# Patient Record
Sex: Female | Born: 1985 | Race: White | Hispanic: No | Marital: Married | Smoking: Never smoker
Health system: Southern US, Community
[De-identification: ages and names within clinical notes are randomized; demographics above are authoritative.]

## PROBLEM LIST (undated history)

## (undated) ENCOUNTER — Inpatient Hospital Stay: Payer: Self-pay

## (undated) DIAGNOSIS — K589 Irritable bowel syndrome without diarrhea: Secondary | ICD-10-CM

## (undated) DIAGNOSIS — D649 Anemia, unspecified: Secondary | ICD-10-CM

## (undated) DIAGNOSIS — Z789 Other specified health status: Secondary | ICD-10-CM

## (undated) DIAGNOSIS — N200 Calculus of kidney: Secondary | ICD-10-CM

## (undated) DIAGNOSIS — N63 Unspecified lump in unspecified breast: Secondary | ICD-10-CM

## (undated) DIAGNOSIS — K5190 Ulcerative colitis, unspecified, without complications: Secondary | ICD-10-CM

## (undated) HISTORY — DX: Calculus of kidney: N20.0

## (undated) HISTORY — PX: NO PAST SURGERIES: SHX2092

---

## 2016-05-23 LAB — OB RESULTS CONSOLE RPR: RPR: NONREACTIVE

## 2016-05-23 LAB — OB RESULTS CONSOLE RUBELLA ANTIBODY, IGM: Rubella: IMMUNE

## 2016-05-23 LAB — OB RESULTS CONSOLE HEPATITIS B SURFACE ANTIGEN: Hepatitis B Surface Ag: NEGATIVE

## 2016-05-23 LAB — OB RESULTS CONSOLE HIV ANTIBODY (ROUTINE TESTING): HIV: NONREACTIVE

## 2016-10-22 DIAGNOSIS — N10 Acute tubulo-interstitial nephritis: Secondary | ICD-10-CM | POA: Diagnosis not present

## 2016-10-22 DIAGNOSIS — O26893 Other specified pregnancy related conditions, third trimester: Secondary | ICD-10-CM | POA: Diagnosis not present

## 2016-10-22 DIAGNOSIS — Z3A30 30 weeks gestation of pregnancy: Secondary | ICD-10-CM | POA: Insufficient documentation

## 2016-10-22 DIAGNOSIS — R109 Unspecified abdominal pain: Secondary | ICD-10-CM | POA: Diagnosis present

## 2016-10-22 LAB — URINALYSIS COMPLETE WITH MICROSCOPIC (ARMC ONLY)
Bilirubin Urine: NEGATIVE
Glucose, UA: NEGATIVE mg/dL
Ketones, ur: NEGATIVE mg/dL
Nitrite: NEGATIVE
Protein, ur: NEGATIVE mg/dL
Specific Gravity, Urine: 1.002 — ABNORMAL LOW (ref 1.005–1.030)
pH: 7 (ref 5.0–8.0)

## 2016-10-22 NOTE — ED Triage Notes (Signed)
Pt states sudden onset of rt flank pain, pt reports that she is [redacted] weeks pregnant, denies vaginal bleeding or abd tightening, pt reports some pain with urination, + nausea

## 2016-10-23 ENCOUNTER — Emergency Department: Payer: BLUE CROSS/BLUE SHIELD

## 2016-10-23 ENCOUNTER — Encounter: Payer: Self-pay | Admitting: Emergency Medicine

## 2016-10-23 ENCOUNTER — Inpatient Hospital Stay
Admission: EM | Admit: 2016-10-23 | Discharge: 2016-10-23 | Disposition: A | Discharge status: Home / Self Care | Payer: BLUE CROSS/BLUE SHIELD | Attending: Obstetrics and Gynecology | Admitting: Obstetrics and Gynecology

## 2016-10-23 DIAGNOSIS — R109 Unspecified abdominal pain: Secondary | ICD-10-CM

## 2016-10-23 DIAGNOSIS — N10 Acute tubulo-interstitial nephritis: Secondary | ICD-10-CM

## 2016-10-23 HISTORY — DX: Other specified health status: Z78.9

## 2016-10-23 HISTORY — DX: Irritable bowel syndrome, unspecified: K58.9

## 2016-10-23 HISTORY — DX: Anemia, unspecified: D64.9

## 2016-10-23 LAB — CBC
HCT: 27.6 % — ABNORMAL LOW (ref 35.0–47.0)
Hemoglobin: 9.2 g/dL — ABNORMAL LOW (ref 12.0–16.0)
MCH: 30.3 pg (ref 26.0–34.0)
MCHC: 33.3 g/dL (ref 32.0–36.0)
MCV: 90.8 fL (ref 80.0–100.0)
Platelets: 241 10*3/uL (ref 150–440)
RBC: 3.04 MIL/uL — ABNORMAL LOW (ref 3.80–5.20)
RDW: 13.8 % (ref 11.5–14.5)
WBC: 14.3 10*3/uL — ABNORMAL HIGH (ref 3.6–11.0)

## 2016-10-23 LAB — COMPREHENSIVE METABOLIC PANEL
ALT: 9 U/L — ABNORMAL LOW (ref 14–54)
AST: 16 U/L (ref 15–41)
Albumin: 2.8 g/dL — ABNORMAL LOW (ref 3.5–5.0)
Alkaline Phosphatase: 115 U/L (ref 38–126)
Anion gap: 6 (ref 5–15)
BUN: 8 mg/dL (ref 6–20)
CO2: 24 mmol/L (ref 22–32)
Calcium: 8.7 mg/dL — ABNORMAL LOW (ref 8.9–10.3)
Chloride: 106 mmol/L (ref 101–111)
Creatinine, Ser: 0.32 mg/dL — ABNORMAL LOW (ref 0.44–1.00)
GFR calc Af Amer: >60 mL/min (ref >60)
GFR calc non Af Amer: >60 mL/min (ref >60)
Glucose, Bld: 89 mg/dL (ref 65–99)
Potassium: 3.8 mmol/L (ref 3.5–5.1)
Sodium: 136 mmol/L (ref 135–145)
Total Bilirubin: <0.1 mg/dL — ABNORMAL LOW (ref 0.3–1.2)
Total Protein: 7.5 g/dL (ref 6.5–8.1)

## 2016-10-23 MED ORDER — NITROFURANTOIN MONOHYD MACRO 100 MG PO CAPS
100.0000 mg | ORAL_CAPSULE | Freq: Once | ORAL | Status: AC
  Administered 2016-10-23: 100 mg via ORAL
  Filled 2016-10-23: qty 1

## 2016-10-23 MED ORDER — NITROFURANTOIN MONOHYD MACRO 100 MG PO CAPS: 100.0000 mg | ORAL_CAPSULE | Freq: Two times a day (BID) | ORAL | 0 refills | Status: DC

## 2016-10-23 MED ORDER — SODIUM CHLORIDE 0.9 % IV BOLUS (SEPSIS)
1000.0000 mL | Freq: Once | INTRAVENOUS | Status: AC
  Administered 2016-10-23: 1000 mL via INTRAVENOUS

## 2016-10-23 NOTE — ED Provider Notes (Signed)
[facility] Emergency Department Provider Note   ____________________________________________   First MD Initiated Contact with Patient 10/23/16 0335     (approximate)  I have reviewed the triage vital signs and the nursing notes.   HISTORY  Chief Complaint Flank Pain    HPI Novalie Leamy is a 30 y.o. female who comes into the hospital today with vaginal pain. She feels that she has a UTI. The patient reports that she also has some right flank pain. Everything started this morning. The vaginal discomfort was this morning but the back pain started this evening. The patient reports that she took 2 Tylenol. She's had no fevers no vomiting and no abdominal pain. The patient states that she has had some burning with urination. Her OB/GYN is in [place]. Currently the patient's pain is a 1 out of 10 in intensity. The patient is nauseous but has had no vomiting. She is [redacted] weeks pregnant with an unremarkable pregnancy. The patient is here tonight to find out what may be going on and have some evaluation.   History reviewed. No pertinent past medical history.  There are no active problems to display for this patient.   History reviewed. No pertinent surgical history.  Prior to Admission medications   Not on File    Allergies Ceclor [cefaclor]; Codeine; Ibuprofen; Penicillins; and Sulfa antibiotics  No family history on file.  Social History Social History  Substance Use Topics  . Smoking status: Never Smoker  . Smokeless tobacco: Not on file  . Alcohol use No    Review of Systems Constitutional: No fever/chills Eyes: No visual changes. ENT: No sore throat. Cardiovascular: Denies chest pain. Respiratory: Denies shortness of breath. Gastrointestinal: No abdominal pain.  No nausea, no vomiting.  No diarrhea.  No constipation. Genitourinary:  dysuria. Musculoskeletal: right back pain. Skin: Negative for rash. Neurological: Negative for headaches,  focal weakness or numbness.  10-point ROS otherwise negative.  ____________________________________________   PHYSICAL EXAM:  VITAL SIGNS: ED Triage Vitals  Enc Vitals Group     BP 10/22/16 2327 104/70     Pulse Rate 10/22/16 2327 99     Resp 10/22/16 2327 18     Temp 10/22/16 2327 97.4 F (36.3 C)     Temp Source 10/22/16 2327 Oral     SpO2 10/22/16 2327 100 %     Weight 10/22/16 2328 120 lb (54.4 kg)     Height 10/22/16 2328 5' 1"  (1.549 m)     Head Circumference --      Peak Flow --      Pain Score 10/22/16 2328 8     Pain Loc --      Pain Edu? --      Excl. in [place]? --     Constitutional: Alert and oriented. Well appearing and in no acute distress. Eyes: Conjunctivae are normal. PERRL. EOMI. Head: Atraumatic. Nose: No congestion/rhinnorhea. Mouth/Throat: Mucous membranes are moist.  Oropharynx non-erythematous. Cardiovascular: Normal rate, regular rhythm. Grossly normal heart sounds.  Good peripheral circulation. Respiratory: Normal respiratory effort.  No retractions. Lungs CTAB. Gastrointestinal: Soft and nontender. No distention. Positive bowel sounds Musculoskeletal: No lower extremity tenderness nor edema.   Neurologic:  Normal speech and language.  Skin:  Skin is warm, dry and intact.  Psychiatric: Mood and affect are normal.   ____________________________________________   LABS (all labs ordered are listed, but only abnormal results are displayed)  Labs Reviewed  URINALYSIS COMPLETEWITH MICROSCOPIC ([place]) - Abnormal; Notable for the  following:       Result Value   Color, Urine STRAW (*)    APPearance CLEAR (*)    Specific Gravity, Urine 1.002 (*)    Hgb urine dipstick 1+ (*)    Leukocytes, UA 1+ (*)    Bacteria, UA MANY (*)    Squamous Epithelial / LPF 0-5 (*)    All other components within normal limits  CBC - Abnormal; Notable for the following:    WBC 14.3 (*)    RBC 3.04 (*)    Hemoglobin 9.2 (*)    HCT 27.6 (*)    All other components  within normal limits  COMPREHENSIVE METABOLIC PANEL - Abnormal; Notable for the following:    Creatinine, Ser 0.32 (*)    Calcium 8.7 (*)    Albumin 2.8 (*)    ALT 9 (*)    Total Bilirubin <0.1 (*)    All other components within normal limits  URINE CULTURE   ____________________________________________  EKG  none ____________________________________________  RADIOLOGY  none ____________________________________________   PROCEDURES  Procedure(s) performed: None  Procedures  Critical Care performed: No  ____________________________________________   INITIAL IMPRESSION / ASSESSMENT AND PLAN / ED COURSE  Pertinent labs & imaging results that were available during my care of the patient were reviewed by me and considered in my medical decision making (see chart for details).  This is a 30 year old female who comes into the hospital today with some right flank pain and dysuria. The patient has been having the symptoms for this today. I will give the patient a dose of Macrobid as she has some significant drug allergies. I did check some blood work and the patient does have a white blood cell count of 14. I will contact the OB/GYN on-call and consider admission for the patient. Received a liter of normal saline.  Clinical Course    I contacted Dr. Ouida Sills to admit the patient. He asked if we had performed an ultrasound of the patient's kidneys which had not been performed. He requested that we perform an ultrasound of the patient's kidneys and then sent her to labor and delivery for evaluation. The patient will receive her ultrasound and then she will be taken to labor and delivery for further eval.  ____________________________________________   FINAL CLINICAL IMPRESSION(S) / ED DIAGNOSES  Final diagnoses:  Flank pain  Acute pyelonephritis      NEW MEDICATIONS STARTED DURING THIS VISIT:  There are no discharge medications for this patient.    Note:  This  document was prepared using Dragon voice recognition software and may include unintentional dictation errors.    Loney Hering, MD 10/23/16 [id number]

## 2016-10-23 NOTE — Discharge Instructions (Signed)
Pregnancy and Urinary Tract Infection WHAT IS A URINARY TRACT INFECTION? A urinary tract infection (UTI) is an infection of any part of the urinary tract. This includes the kidneys, the tubes that connect your kidneys to your bladder (ureters), the bladder, and the tube that carries urine out of your body (urethra). These organs make, store, and get rid of urine in the body. A UTI can be a bladder infection (cystitis) or a kidney infection (pyelonephritis). This infection may be caused by fungi, viruses, and bacteria. Bacteria are the most common cause of UTIs. You are more likely to develop a UTI during pregnancy because:  The physical and hormonal changes your body goes through can make it easier for bacteria to get into your urinary tract.  Your growing baby puts pressure on your uterus and can affect urine flow. DOES A UTI PLACE MY BABY AT RISK? An untreated UTI during pregnancy could lead to a kidney infection, which can cause health problems that could affect your baby. Possible complications of an untreated UTI include:  Having your baby before 37 weeks of pregnancy (premature).  Having a baby with a low birth weight.  Developing high blood pressure during pregnancy (preeclampsia). WHAT ARE THE SYMPTOMS OF A UTI? Symptoms of a UTI include:  Fever.  Frequent urination or passing small amounts of urine frequently.  Needing to urinate urgently.  Pain or a burning sensation with urination.  Urine that smells bad or unusual.  Cloudy urine.  Pain in the lower abdomen or back.  Trouble urinating.  Blood in the urine.  Vomiting or being less hungry than normal.  Diarrhea or abdominal pain.  Vaginal discharge. WHAT ARE THE TREATMENT OPTIONS FOR A UTI DURING PREGNANCY? Treatment for this condition may include:  Antibiotic medicines that are safe to take during pregnancy.  Other medicines to treat less common causes of UTI. HOW CAN I PREVENT A UTI? To prevent a UTI:  Go  to the bathroom as soon as you feel the need.  Always wipe from front to back.  Wash your genital area with soap and warm water daily.  Empty your bladder before and after sex.  Wear cotton underwear.  Limit your intake of high sugar foods or drinks, such as regular soda, juice, and sweets..  Drink 6-8 glasses of water daily.  Do not wear tight-fitting pants.  Do not douche or use deodorant sprays.  Do not drink alcohol, caffeine, or carbonated drinks. These can irritate the bladder. WHEN SHOULD I SEEK MEDICAL CARE? Seek medical care if:  Your symptoms do not improve or get worse.  You have a fever after two days of treatment.  You have a rash.  You have abnormal vaginal discharge.  You have back or side pain.  You have chills.  You have nausea and vomiting. WHEN SHOULD I SEEK IMMEDIATE MEDICAL CARE? Seek immediate medical care if you are pregnant and:  You feel contractions in your uterus.  You have lower belly pain.  You have a gush of fluid from your vagina.  You have blood in your urine.  You are vomiting and cannot keep down any medicines or water. This information is not intended to replace advice given to you by your health care provider. Make sure you discuss any questions you have with your health care provider. Document Released: 03/10/2011 Document Revised: 04/17/2016 Document Reviewed: 10/04/2015 Elsevier Interactive Patient Education  2017 [employer].

## 2016-10-23 NOTE — ED Notes (Signed)
Pt to [place], Pt to be moved to L&D Observation RM2. Report given to Cyril Mourning, RN for care after [place].

## 2016-10-23 NOTE — OB Triage Note (Signed)
Pt reports feeling like she had a UIT most of the day- Nov 26th. Reports nausea, reports pain over R flank at its strongest 6-7/10- now relieved. Does not denote frequency, urgency. Reports some "stinging" with urination.

## 2016-10-23 NOTE — OB Triage Provider Note (Signed)
Triage visit for NST   Alice Sherman is a 30 y.o. G1P0. She is at 16w4dgestation. She presents from the ED with urinary tract infection symptoms that started Sunday morning.  She reported having some right flank pain on Sunday, which has now resolved. She reports some slight dysuria and urgency.  She denies frequency, VB, abnormal vaginal dc, nausea, vomiting, constipation, diarrhea, fever, chills.  She received 1 liter of fluid and 1 dose of Macrobid in the emergency room.  She denies pain currently.  She receives her prenatal care at [facility] [place]  She reports an uncomplicated prenatal hx except for a viral infection in early pregnancy that has resolved.  She also reports some anemia and has been on an iron supplement.  She endorses good fetal movement and denies contractions.  She states she is feeling much better and desires to be discharged home.    O:  BP 112/68   Pulse 88   Temp 98.1 F (36.7 C) (Oral)   Resp 16   Ht 5' 1"  (1.549 m)   Wt 54.4 kg (120 lb)   SpO2 100%   BMI 22.67 kg/m  Results for orders placed or performed during the hospital encounter of 10/23/16 (from the past 48 hour(s))  [facility]   Collection Time: 10/22/16 11:28 PM  Result Value Ref Range   Color, Urine STRAW (A) YELLOW   APPearance CLEAR (A) CLEAR   Glucose, UA NEGATIVE NEGATIVE mg/dL   Bilirubin Urine NEGATIVE NEGATIVE   Ketones, ur NEGATIVE NEGATIVE mg/dL   Specific Gravity, Urine 1.002 (L) 1.005 - 1.030   Hgb urine dipstick 1+ (A) NEGATIVE   pH 7.0 5.0 - 8.0   Protein, ur NEGATIVE NEGATIVE mg/dL   Nitrite NEGATIVE NEGATIVE   Leukocytes, UA 1+ (A) NEGATIVE   RBC / HPF 0-5 0 - 5 RBC/hpf   WBC, UA 6-30 0 - 5 WBC/hpf   Bacteria, UA MANY (A) NONE SEEN   Squamous Epithelial / LPF 0-5 (A) NONE SEEN  CBC   Collection Time: 10/23/16  3:18 AM  Result Value Ref Range   WBC 14.3 (H) 3.6 - 11.0 K/uL   RBC 3.04 (L) 3.80 - 5.20 MIL/uL   Hemoglobin  9.2 (L) 12.0 - 16.0 g/dL   HCT 27.6 (L) 35.0 - 47.0 %   MCV 90.8 80.0 - 100.0 fL   MCH 30.3 26.0 - 34.0 pg   MCHC 33.3 32.0 - 36.0 g/dL   RDW 13.8 11.5 - 14.5 %   Platelets 241 150 - 440 K/uL  Comprehensive metabolic panel   Collection Time: 10/23/16  3:18 AM  Result Value Ref Range   Sodium 136 135 - 145 mmol/L   Potassium 3.8 3.5 - 5.1 mmol/L   Chloride 106 101 - 111 mmol/L   CO2 24 22 - 32 mmol/L   Glucose, Bld 89 65 - 99 mg/dL   BUN 8 6 - 20 mg/dL   Creatinine, Ser 0.32 (L) 0.44 - 1.00 mg/dL   Calcium 8.7 (L) 8.9 - 10.3 mg/dL   Total Protein 7.5 6.5 - 8.1 g/dL   Albumin 2.8 (L) 3.5 - 5.0 g/dL   AST 16 15 - 41 U/L   ALT 9 (L) 14 - 54 U/L   Alkaline Phosphatase 115 38 - 126 U/L   Total Bilirubin <0.1 (L) 0.3 - 1.2 mg/dL   GFR calc non Af Amer >60 >60 mL/min   GFR calc Af Amer >60 >  60 mL/min   Anion gap 6 5 - 15      Renal Ultrasound this morning: Mild-to-moderate right-sided hydronephrosis. Bilateral renal jets are present however the right is diminished relative to the left. Findings suggest partial right-sided obstruction.   Gen: NAD, AAOx3   Lungs: clear, equal bilaterally Heart: S1, S2, RRR    Abd: FNTTP      Ext: Non-tender, Nonedmeatous No CVAT bilaterally    FHT: Baseline: 140 bpm/ moderate variability/ +accels/ no decels TOCO: quiet SVE:   Deferred   A/P:  30 y.o. G1P0 89w4dwith urinary tract infection vs. nephrolithiasis.    Discussed labs and ultrasound findings with patient and husband - possibly UTI vs. Nephrolithiasis - pt. Reports family hx of kidney stones   Reactive NST, with moderate variability and accelerations, no decels  Fetal Wellbeing: Reassuring Category 1 Fetal Tracing  Continue Macrobid 1068mBID x 7 days  Precautions given, Preterm labor and warning s/s reviewed  FKC's daily  Although, elevated WBC, pt. Has been afebrile, and okay to continue Macrobid,   Advised to strain urine to look for stone  Increase oral hydration  and call for worsening s/s of pain   D/c home stable, precautions reviewed  [place] [facility] [place]   Dr. WaLeonides Schanzware and agrees with plan of care  MeLars PinksCNM

## 2016-10-24 ENCOUNTER — Encounter: Payer: Self-pay | Admitting: *Deleted

## 2016-10-24 ENCOUNTER — Observation Stay
Admission: EM | Admit: 2016-10-24 | Discharge: 2016-10-24 | Disposition: A | Discharge status: Home / Self Care | Payer: BLUE CROSS/BLUE SHIELD | Attending: Obstetrics & Gynecology | Admitting: Obstetrics & Gynecology

## 2016-10-24 ENCOUNTER — Inpatient Hospital Stay (HOSPITAL_COMMUNITY)
Admission: AD | Admit: 2016-10-24 | Discharge: 2016-10-28 | DRG: 694 | Disposition: A | Discharge status: Home / Self Care | Payer: BLUE CROSS/BLUE SHIELD | Source: Ambulatory Visit | Attending: Obstetrics and Gynecology | Admitting: Obstetrics and Gynecology

## 2016-10-24 ENCOUNTER — Encounter (HOSPITAL_COMMUNITY): Payer: Self-pay

## 2016-10-24 DIAGNOSIS — N200 Calculus of kidney: Secondary | ICD-10-CM | POA: Diagnosis not present

## 2016-10-24 DIAGNOSIS — O26833 Pregnancy related renal disease, third trimester: Secondary | ICD-10-CM

## 2016-10-24 DIAGNOSIS — N132 Hydronephrosis with renal and ureteral calculous obstruction: Principal | ICD-10-CM | POA: Diagnosis present

## 2016-10-24 DIAGNOSIS — Z880 Allergy status to penicillin: Secondary | ICD-10-CM

## 2016-10-24 DIAGNOSIS — O2693 Pregnancy related conditions, unspecified, third trimester: Secondary | ICD-10-CM | POA: Diagnosis not present

## 2016-10-24 DIAGNOSIS — O26839 Pregnancy related renal disease, unspecified trimester: Secondary | ICD-10-CM

## 2016-10-24 DIAGNOSIS — Z3A30 30 weeks gestation of pregnancy: Secondary | ICD-10-CM

## 2016-10-24 DIAGNOSIS — N23 Unspecified renal colic: Secondary | ICD-10-CM | POA: Diagnosis present

## 2016-10-24 DIAGNOSIS — O9989 Other specified diseases and conditions complicating pregnancy, childbirth and the puerperium: Secondary | ICD-10-CM | POA: Diagnosis present

## 2016-10-24 DIAGNOSIS — Z882 Allergy status to sulfonamides status: Secondary | ICD-10-CM

## 2016-10-24 DIAGNOSIS — Z8249 Family history of ischemic heart disease and other diseases of the circulatory system: Secondary | ICD-10-CM

## 2016-10-24 DIAGNOSIS — Z79899 Other long term (current) drug therapy: Secondary | ICD-10-CM

## 2016-10-24 DIAGNOSIS — Z886 Allergy status to analgesic agent status: Secondary | ICD-10-CM

## 2016-10-24 DIAGNOSIS — Z888 Allergy status to other drugs, medicaments and biological substances status: Secondary | ICD-10-CM

## 2016-10-24 LAB — URINALYSIS, ROUTINE W REFLEX MICROSCOPIC
Bilirubin Urine: NEGATIVE
Glucose, UA: NEGATIVE mg/dL
Ketones, ur: 40 mg/dL — AB
Nitrite: NEGATIVE
Protein, ur: NEGATIVE mg/dL
Specific Gravity, Urine: <1.005 — ABNORMAL LOW (ref 1.005–1.030)
pH: 5.5 (ref 5.0–8.0)

## 2016-10-24 LAB — URINE MICROSCOPIC-ADD ON: RBC / HPF: NONE SEEN RBC/hpf (ref 0–5)

## 2016-10-24 LAB — URINE CULTURE: Culture: NO GROWTH

## 2016-10-24 LAB — ABO/RH: ABO/RH(D): A POS

## 2016-10-24 LAB — TYPE AND SCREEN
ABO/RH(D): A POS
Antibody Screen: NEGATIVE

## 2016-10-24 MED ORDER — ONDANSETRON HCL 4 MG/2ML IJ SOLN
4.0000 mg | Freq: Four times a day (QID) | INTRAMUSCULAR | Status: DC | PRN
  Administered 2016-10-25: 4 mg via INTRAVENOUS
  Filled 2016-10-24: qty 2

## 2016-10-24 MED ORDER — ZOLPIDEM TARTRATE 5 MG PO TABS
5.0000 mg | ORAL_TABLET | Freq: Every evening | ORAL | Status: DC | PRN
  Administered 2016-10-28 (×2): 5 mg via ORAL
  Filled 2016-10-24: qty 1

## 2016-10-24 MED ORDER — LACTATED RINGERS IV BOLUS (SEPSIS): 1000.0000 mL | Freq: Once | INTRAVENOUS | Status: DC

## 2016-10-24 MED ORDER — PRENATAL MULTIVITAMIN CH
1.0000 | ORAL_TABLET | Freq: Every day | ORAL | Status: DC
  Administered 2016-10-25 – 2016-10-27 (×3): 1 via ORAL
  Filled 2016-10-24 (×4): qty 1

## 2016-10-24 MED ORDER — DIPHENHYDRAMINE HCL 12.5 MG/5ML PO ELIX: 12.5000 mg | ORAL_SOLUTION | Freq: Four times a day (QID) | ORAL | Status: DC | PRN

## 2016-10-24 MED ORDER — HYDROMORPHONE HCL 1 MG/ML IJ SOLN
INTRAMUSCULAR | Status: AC
  Administered 2016-10-24: 0.5 mg via INTRAVENOUS
  Filled 2016-10-24: qty 1

## 2016-10-24 MED ORDER — HYDROMORPHONE HCL 1 MG/ML IJ SOLN
0.5000 mg | Freq: Once | INTRAMUSCULAR | Status: AC
  Administered 2016-10-24: 0.5 mg via INTRAVENOUS

## 2016-10-24 MED ORDER — ACETAMINOPHEN 325 MG PO TABS: 650.0000 mg | ORAL_TABLET | ORAL | Status: DC | PRN

## 2016-10-24 MED ORDER — CALCIUM CARBONATE ANTACID 500 MG PO CHEW
2.0000 | CHEWABLE_TABLET | ORAL | Status: DC | PRN
  Administered 2016-10-27 (×3): 400 mg via ORAL
  Filled 2016-10-24 (×3): qty 2

## 2016-10-24 MED ORDER — DIPHENHYDRAMINE HCL 50 MG/ML IJ SOLN: 12.5000 mg | Freq: Four times a day (QID) | INTRAMUSCULAR | Status: DC | PRN

## 2016-10-24 MED ORDER — LACTATED RINGERS IV SOLN
INTRAVENOUS | Status: DC
  Administered 2016-10-24 – 2016-10-27 (×2): via INTRAVENOUS

## 2016-10-24 MED ORDER — HYDROMORPHONE HCL 1 MG/ML IJ SOLN
1.0000 mg | Freq: Once | INTRAMUSCULAR | Status: AC
  Administered 2016-10-24: 1 mg via INTRAVENOUS
  Filled 2016-10-24: qty 1

## 2016-10-24 MED ORDER — NALOXONE HCL 0.4 MG/ML IJ SOLN: 0.4000 mg | INTRAMUSCULAR | Status: DC | PRN

## 2016-10-24 MED ORDER — DOCUSATE SODIUM 100 MG PO CAPS
100.0000 mg | ORAL_CAPSULE | Freq: Every day | ORAL | Status: DC
  Filled 2016-10-24 (×2): qty 1

## 2016-10-24 MED ORDER — ONDANSETRON HCL 4 MG/2ML IJ SOLN
4.0000 mg | Freq: Once | INTRAMUSCULAR | Status: AC
  Administered 2016-10-24: 4 mg via INTRAVENOUS
  Filled 2016-10-24: qty 2

## 2016-10-24 MED ORDER — LACTATED RINGERS IV BOLUS (SEPSIS)
1000.0000 mL | Freq: Once | INTRAVENOUS | Status: AC
  Administered 2016-10-24: 1000 mL via INTRAVENOUS

## 2016-10-24 MED ORDER — ONDANSETRON 8 MG PO TBDP: 8.0000 mg | ORAL_TABLET | Freq: Three times a day (TID) | ORAL | 0 refills | Status: DC | PRN

## 2016-10-24 MED ORDER — OXYCODONE-ACETAMINOPHEN 5-325 MG PO TABS: 1.0000 | ORAL_TABLET | ORAL | 0 refills | Status: DC | PRN

## 2016-10-24 MED ORDER — SODIUM CHLORIDE 0.9% FLUSH: 9.0000 mL | INTRAVENOUS | Status: DC | PRN

## 2016-10-24 MED ORDER — LACTATED RINGERS IV SOLN
INTRAVENOUS | Status: DC
  Administered 2016-10-24 – 2016-10-28 (×7): via INTRAVENOUS

## 2016-10-24 MED ORDER — HYDROMORPHONE 1 MG/ML IV SOLN
INTRAVENOUS | Status: DC
  Administered 2016-10-24: 20:00:00 via INTRAVENOUS
  Administered 2016-10-24: 2.3 mg via INTRAVENOUS
  Administered 2016-10-25: 2.6 mg via INTRAVENOUS
  Administered 2016-10-25: 11 mg via INTRAVENOUS
  Administered 2016-10-25: 2.8 mg via INTRAVENOUS
  Administered 2016-10-25: 10 mg via INTRAVENOUS
  Administered 2016-10-25: 0.8 mg via INTRAVENOUS
  Administered 2016-10-25: 12 mg via INTRAVENOUS
  Administered 2016-10-26: 4.2 mg via INTRAVENOUS
  Administered 2016-10-26: 2 mg via INTRAVENOUS
  Administered 2016-10-26: 2.6 mg via INTRAVENOUS
  Administered 2016-10-26: 4.2 mg via INTRAVENOUS
  Administered 2016-10-26: 2.2 mg via INTRAVENOUS
  Administered 2016-10-27: 3.6 mg via INTRAVENOUS
  Administered 2016-10-27: 1 mg via INTRAVENOUS
  Filled 2016-10-24 (×2): qty 25

## 2016-10-24 NOTE — Discharge Instructions (Signed)
Discharge instructions given to patient; also a prescription given along with instructions on how to take medication.  Family members at the bedside, family is going to take patient to her appointment at [facility] in [place].

## 2016-10-24 NOTE — OB Triage Note (Signed)
Pt. Present with lower back pain, pain started this a.m. Around 0530. Denies sudden gush of fluid, vaginal bleeding and positive for fetal movement.  Attached to U/S -FHR 140s, toco applied - soft abdomen. Last sexual encounter was "a couple of months ago".

## 2016-10-24 NOTE — MAU Provider Note (Signed)
Chief Complaint:  Nephrolithiasis   First Provider Initiated Contact with Patient 10/24/16 1554      HPI: Alice Sherman is a 29 y.o. G1P0 at 43w5dwho presents to maternity admissions reporting increased right flank and right lower back pain.  [facility] and diagnosed with right nephrolithiasis with hydronephrosis. She followed up today in the office and was prescribed PO pain medication, which she took x 1 dose and reports it is not improving her severe pain. She is concerned that she will go home and pain will worsen.  She has tried heating pad, the Percocet prescribed today, position changes and increased PO fluids but nothing is helping. Movement and certain positions make the pain worse.   She reports good fetal movement, denies cramping/contractions, LOF, vaginal bleeding, vaginal itching/burning, urinary symptoms, h/a, dizziness, n/v, or fever/chills.    HPI  Past Medical History: Past Medical History:  Diagnosis Date  . Anemia    with pregnancy  . Irritable bowel syndrome (IBS)    symptoms mostly diarrhea  . Medical history non-contributory     Past obstetric history: OB History  Gravida Para Term Preterm AB Living  1            SAB TAB Ectopic Multiple Live Births               # Outcome Date GA Lbr Len/2nd Weight Sex Delivery Anes PTL Lv  1 Current               Past Surgical History: Past Surgical History:  Procedure Laterality Date  . NO PAST SURGERIES      Family History: History reviewed. No pertinent family history.  Social History: Social History  Substance Use Topics  . Smoking status: Never Smoker  . Smokeless tobacco: Never Used  . Alcohol use No    Allergies:  Allergies  Allergen Reactions  . Ceclor [Cefaclor] Hives, Swelling and Other (See Comments)    Reaction:  Lip swelling   . Codeine Hives, Nausea And Vomiting, Swelling and Other (See Comments)    Reaction:  Lip swelling  . Ibuprofen  Hives, Swelling and Other (See Comments)    Reaction:  Swelling of lips and hands   . Penicillins Hives, Swelling and Other (See Comments)    Reaction:  Swelling of lips and hands  Has patient had a PCN reaction causing immediate rash, facial/tongue/throat swelling, SOB or lightheadedness with hypotension: Yes Has patient had a PCN reaction causing severe rash involving mucus membranes or skin necrosis: No Has patient had a PCN reaction that required hospitalization No Has patient had a PCN reaction occurring within the last 10 years: No If all of the above answers are "NO", then may proceed with Cephalosporin use.  . Sulfa Antibiotics Hives    Meds:  Prescriptions Prior to Admission  Medication Sig Dispense Refill Last Dose  . ferrous sulfate 325 (65 FE) MG tablet Take 325 mg by mouth at bedtime.   Past Month at Unknown time  . ondansetron (ZOFRAN ODT) 8 MG disintegrating tablet Take 1 tablet (8 mg total) by mouth every 8 (eight) hours as needed for nausea or vomiting. 30 tablet 0 10/24/2016 at Unknown time  . oxyCODONE-acetaminophen (ROXICET) 5-325 MG tablet Take 1-2 tablets by mouth every 4 (four) hours as needed for severe pain. 25 tablet 0 10/24/2016 at 1130  . Prenatal Vit-Fe Fumarate-FA (PRENATAL MULTIVITAMIN) TABS tablet Take 1 tablet by mouth at  bedtime.   Past Month at Unknown time  . nitrofurantoin, macrocrystal-monohydrate, (MACROBID) 100 MG capsule Take 1 capsule (100 mg total) by mouth 2 (two) times daily. (Patient not taking: Reported on 10/24/2016) 13 capsule 0 Not Taking at Unknown time    ROS:  Review of Systems  Constitutional: Negative for chills, fatigue and fever.  Eyes: Negative for visual disturbance.  Respiratory: Negative for shortness of breath.   Cardiovascular: Negative for chest pain.  Gastrointestinal: Negative for abdominal pain, nausea and vomiting.  Genitourinary: Positive for dysuria and flank pain. Negative for difficulty urinating, pelvic pain,  vaginal bleeding, vaginal discharge and vaginal pain.  Musculoskeletal: Positive for back pain.  Neurological: Negative for dizziness and headaches.  Psychiatric/Behavioral: Negative.      I have reviewed patient's Past Medical Hx, Surgical Hx, Family Hx, Social Hx, medications and allergies.   Physical Exam  Patient Vitals for the past 24 hrs:  BP Temp Temp src Pulse Resp  10/24/16 1549 119/77 97.7 F (36.5 C) Oral 87 18   Constitutional: Well-developed, well-nourished female in no acute distress.  Cardiovascular: normal rate Respiratory: normal effort GI: Abd soft, non-tender, gravid appropriate for gestational age.  MS: Extremities nontender, no edema, normal ROM Neurologic: Alert and oriented x 4.  GU: Neg CVAT.      FHT:  Baseline 145, moderate variability, accelerations present, no decelerations Contractions: None on toco or to palpation   Labs: Results for orders placed or performed during the hospital encounter of 10/24/16 (from the past 24 hour(s))  Urinalysis, Routine w reflex microscopic (not at [facility])     Status: Abnormal   Collection Time: 10/24/16  3:39 PM  Result Value Ref Range   Color, Urine YELLOW YELLOW   APPearance CLEAR CLEAR   Specific Gravity, Urine <1.005 (L) 1.005 - 1.030   pH 5.5 5.0 - 8.0   Glucose, UA NEGATIVE NEGATIVE mg/dL   Hgb urine dipstick TRACE (A) NEGATIVE   Bilirubin Urine NEGATIVE NEGATIVE   Ketones, ur 40 (A) NEGATIVE mg/dL   Protein, ur NEGATIVE NEGATIVE mg/dL   Nitrite NEGATIVE NEGATIVE   Leukocytes, UA SMALL (A) NEGATIVE  Urine microscopic-add on     Status: Abnormal   Collection Time: 10/24/16  3:39 PM  Result Value Ref Range   Squamous Epithelial / LPF 0-5 (A) NONE SEEN   WBC, UA 0-5 0 - 5 WBC/hpf   RBC / HPF NONE SEEN 0 - 5 RBC/hpf   Bacteria, UA FEW (A) NONE SEEN      Imaging:  US Renal  Result Date: 10/23/2016 CLINICAL DATA:  30 y/o  F; right flank pain.  Pregnant patient. EXAM: RENAL / URINARY TRACT ULTRASOUND  COMPLETE COMPARISON:  None. FINDINGS: Right Kidney: Length: 12.1 cm. Echogenicity within normal limits. Mild-to-moderate right-sided hydronephrosis. Left Kidney: Length: 12.2 cm. Echogenicity within normal limits. No mass or hydronephrosis visualized. Bladder: Normal bladder. Bilateral renal jets are present however the right is diminished relative to the left. IMPRESSION: Mild-to-moderate right-sided hydronephrosis. Bilateral renal jets are present however the right is diminished relative to the left. Findings suggest partial right-sided obstruction. Electronically Signed   By: Kristine Garbe M.D.   On: 10/23/2016 06:32    MAU Course/MDM: I have ordered labs and reviewed results.  NST reviewed Consult Dr Gaetano Net with presentation, exam findings and test results.  Treatments in MAU included Dilaudid 1 mg x 2 doses, LR x 1000 ml.  Pt pain improved slightly but then worsened after medications/fluids.   Admit to antepartum  for pain management/Urology consult.  Dr Gaetano Net to bedside to evaluate. Pt stable at time of transfer.  Assessment: 1. Pregnancy complicated by nephrolithiasis in third trimester, antepartum     Plan: Admit for pain management   Fatima Blank Certified Nurse-Midwife 10/24/2016 6:33 PM

## 2016-10-24 NOTE — H&P (Signed)
Elvera Maria, CNM Certified Nurse Midwife Cosign Needed Obstetrics  MAU Provider Note Date of Service: 10/24/2016 6:32 PM    Expand All Collapse All   [] Hide copied text [] Hover for attribution information Chief Complaint:  Nephrolithiasis   First Provider Initiated Contact with Patient 10/24/16 1554      HPI: Alice Sherman is a 30 y.o. G1P0 at 80w5dwho presents to maternity admissions reporting increased right flank and right lower back pain.  [facility] and diagnosed with right nephrolithiasis with hydronephrosis. She followed up today in the office and was prescribed PO pain medication, which she took x 1 dose and reports it is not improving her severe pain. She is concerned that she will go home and pain will worsen.  She has tried heating pad, the Percocet prescribed today, position changes and increased PO fluids but nothing is helping. Movement and certain positions make the pain worse.   She reports good fetal movement, denies cramping/contractions, LOF, vaginal bleeding, vaginal itching/burning, urinary symptoms, h/a, dizziness, n/v, or fever/chills.    HPI  Past Medical History:     Past Medical History:  Diagnosis Date  . Anemia    with pregnancy  . Irritable bowel syndrome (IBS)    symptoms mostly diarrhea  . Medical history non-contributory     Past obstetric history:                 OB History  Gravida Para Term Preterm AB Living  1            SAB TAB Ectopic Multiple Live Births               # Outcome Date GA Lbr Len/2nd Weight Sex Delivery Anes PTL Lv  1 Current               Past Surgical History:      Past Surgical History:  Procedure Laterality Date  . NO PAST SURGERIES      Family History: History reviewed. No pertinent family history.  Social History:     Social History  Substance Use Topics  . Smoking status: Never Smoker  . Smokeless tobacco: Never  Used  . Alcohol use No    Allergies:       Allergies  Allergen Reactions  . Ceclor [Cefaclor] Hives, Swelling and Other (See Comments)    Reaction:  Lip swelling   . Codeine Hives, Nausea And Vomiting, Swelling and Other (See Comments)    Reaction:  Lip swelling  . Ibuprofen Hives, Swelling and Other (See Comments)    Reaction:  Swelling of lips and hands   . Penicillins Hives, Swelling and Other (See Comments)    Reaction:  Swelling of lips and hands  Has patient had a PCN reaction causing immediate rash, facial/tongue/throat swelling, SOB or lightheadedness with hypotension: Yes Has patient had a PCN reaction causing severe rash involving mucus membranes or skin necrosis: No Has patient had a PCN reaction that required hospitalization No Has patient had a PCN reaction occurring within the last 10 years: No If all of the above answers are "NO", then may proceed with Cephalosporin use.  . Sulfa Antibiotics Hives    Meds:         Prescriptions Prior to Admission  Medication Sig Dispense Refill Last Dose  . ferrous sulfate 325 (65 FE) MG tablet Take 325 mg by mouth at bedtime.   Past Month at Unknown  time  . ondansetron (ZOFRAN ODT) 8 MG disintegrating tablet Take 1 tablet (8 mg total) by mouth every 8 (eight) hours as needed for nausea or vomiting. 30 tablet 0 10/24/2016 at Unknown time  . oxyCODONE-acetaminophen (ROXICET) 5-325 MG tablet Take 1-2 tablets by mouth every 4 (four) hours as needed for severe pain. 25 tablet 0 10/24/2016 at 1130  . Prenatal Vit-Fe Fumarate-FA (PRENATAL MULTIVITAMIN) TABS tablet Take 1 tablet by mouth at bedtime.   Past Month at Unknown time  . nitrofurantoin, macrocrystal-monohydrate, (MACROBID) 100 MG capsule Take 1 capsule (100 mg total) by mouth 2 (two) times daily. (Patient not taking: Reported on 10/24/2016) 13 capsule 0 Not Taking at Unknown time    ROS:  Review of Systems  Constitutional: Negative for chills, fatigue and  fever.  Eyes: Negative for visual disturbance.  Respiratory: Negative for shortness of breath.   Cardiovascular: Negative for chest pain.  Gastrointestinal: Negative for abdominal pain, nausea and vomiting.  Genitourinary: Positive for dysuria and flank pain. Negative for difficulty urinating, pelvic pain, vaginal bleeding, vaginal discharge and vaginal pain.  Musculoskeletal: Positive for back pain.  Neurological: Negative for dizziness and headaches.  Psychiatric/Behavioral: Negative.      I have reviewed patient's Past Medical Hx, Surgical Hx, Family Hx, Social Hx, medications and allergies.   Physical Exam  Patient Vitals for the past 24 hrs:  BP Temp Temp src Pulse Resp  10/24/16 1549 119/77 97.7 F (36.5 C) Oral 87 18   Constitutional: Well-developed, well-nourished female in no acute distress.  Cardiovascular: normal rate Respiratory: normal effort GI: Abd soft, non-tender, gravid appropriate for gestational age.  MS: Extremities nontender, no edema, normal ROM Neurologic: Alert and oriented x 4.  GU: Neg CVAT.      FHT:  Baseline 145, moderate variability, accelerations present, no decelerations Contractions: None on toco or to palpation   Labs: Lab Results Last 24 Hours       Results for orders placed or performed during the hospital encounter of 10/24/16 (from the past 24 hour(s))  Urinalysis, Routine w reflex microscopic (not at [facility])     Status: Abnormal   Collection Time: 10/24/16  3:39 PM  Result Value Ref Range   Color, Urine YELLOW YELLOW   APPearance CLEAR CLEAR   Specific Gravity, Urine <1.005 (L) 1.005 - 1.030   pH 5.5 5.0 - 8.0   Glucose, UA NEGATIVE NEGATIVE mg/dL   Hgb urine dipstick TRACE (A) NEGATIVE   Bilirubin Urine NEGATIVE NEGATIVE   Ketones, ur 40 (A) NEGATIVE mg/dL   Protein, ur NEGATIVE NEGATIVE mg/dL   Nitrite NEGATIVE NEGATIVE   Leukocytes, UA SMALL (A) NEGATIVE  Urine microscopic-add on     Status: Abnormal    Collection Time: 10/24/16  3:39 PM  Result Value Ref Range   Squamous Epithelial / LPF 0-5 (A) NONE SEEN   WBC, UA 0-5 0 - 5 WBC/hpf   RBC / HPF NONE SEEN 0 - 5 RBC/hpf   Bacteria, UA FEW (A) NONE SEEN        Imaging:   Imaging Results  US Renal  Result Date: 10/23/2016 CLINICAL DATA:  30 y/o  F; right flank pain.  Pregnant patient. EXAM: RENAL / URINARY TRACT ULTRASOUND COMPLETE COMPARISON:  None. FINDINGS: Right Kidney: Length: 12.1 cm. Echogenicity within normal limits. Mild-to-moderate right-sided hydronephrosis. Left Kidney: Length: 12.2 cm. Echogenicity within normal limits. No mass or hydronephrosis visualized. Bladder: Normal bladder. Bilateral renal jets are present however the right is diminished relative to  the left. IMPRESSION: Mild-to-moderate right-sided hydronephrosis. Bilateral renal jets are present however the right is diminished relative to the left. Findings suggest partial right-sided obstruction. Electronically Signed   By: Kristine Garbe M.D.   On: 10/23/2016 06:32     MAU Course/MDM: I have ordered labs and reviewed results.  NST reviewed Consult Dr Gaetano Net with presentation, exam findings and test results.  Treatments in MAU included Dilaudid 1 mg x 2 doses, LR x 1000 ml.  Pt pain improved slightly but then worsened after medications/fluids.   Admit to antepartum for pain management/Urology consult.  Dr Gaetano Net to bedside to evaluate. Pt stable at time of transfer.  Assessment: 1. Pregnancy complicated by nephrolithiasis in third trimester, antepartum     Plan: Admit for pain management   Fatima Blank Certified Nurse-Midwife 10/24/2016 6:33 PM     30 yo G1P0 @ 57 5/7 weeks with right flank pain C/W kidney stone. U/S demonstrated moderate right hydronephrosis. Bilateral ureteral jets were noted although felt to be diminished on the right. Urine culture from 10/22/16 is negative. Colicky pain in right flank continues. It  has been partially relieved with IV dilaudid and Flomax x 1. However, pain returns and patient does not think she can manage it at home. No gross hematuria, no N/V, no fever/chills. Her father has had multiple kidney stones.  Vitals:   10/24/16 1549  BP: 119/77  Pulse: 87  Resp: 18  Temp: 97.7 F (36.5 C)   Lungs CTA Cor RRR Back without CVAT Abdomen gravid, uterus soft and NT. Mildly tender along course of right ureter DTR 1+  FHT cat one  A/P: Probable right kidney stone         Failed outpatient management         Observe tonight with IV fluids, PCA, strain urine         If pain persists in am, consider urology consultation

## 2016-10-24 NOTE — Discharge Summary (Signed)
Alice Sherman is a 30 y.o. female. She is at 5w5dgestation.  Chief Complaint:  S:  no CTX, no VB.no LOF,  Active fetal movement.   Location: right side flank Context: patient was seen recently for right sided flank pain, was started on Macrobid on 11/26 for presumed UTI, and had ultrasound that showed right sided hydronephrosis and possible obstruction.  She has a strong family history of kidney stones.  She presents today with significantly increase pain on her right side radiating around her flank.   Onset/timing/duration: started ~5:30 AM today Quality: sharp, intense Severity:  Severe 10/10 Aggravating factors: nothing Alleviating factors: nothing, standing vs sitting Associated signs/symptoms: see above, denies fever chills    Maternal Medical History:   Past Medical History:  Diagnosis Date  . Anemia    with pregnancy  . Irritable bowel syndrome (IBS)    symptoms mostly diarrhea  . Medical history non-contributory     Past Surgical History:  Procedure Laterality Date  . NO PAST SURGERIES      Allergies  Allergen Reactions  . Ceclor [Cefaclor] Other (See Comments)    Pt reports hives, swollen lips  . Codeine Other (See Comments)    Pt reports hives, swollen lips  . Ibuprofen Other (See Comments)    Pt reports taken at same time she received PCN. Hives, swollen lips, hands  . Penicillins Other (See Comments)    Hives, swollen , hands  . Sulfa Antibiotics Other (See Comments)    Hives    Prior to Admission medications   Medication Sig Start Date End Date Taking? Authorizing Provider  Multiple Vitamin (MULTIVITAMIN) tablet Take 1 tablet by mouth daily.   Yes Historical Provider, MD  nitrofurantoin, macrocrystal-monohydrate, (MACROBID) 100 MG capsule Take 1 capsule (100 mg total) by mouth 2 (two) times daily. 10/23/16  Yes Meredith C Sigmon, CNM  ondansetron (ZOFRAN ODT) 8 MG disintegrating tablet Take 1 tablet (8 mg total) by mouth every 8 (eight) hours as needed for  nausea or vomiting. 10/24/16   Chelsea C Ward, MD  oxyCODONE-acetaminophen (ROXICET) 5-325 MG tablet Take 1-2 tablets by mouth every 4 (four) hours as needed for severe pain. 10/24/16   CHonor LohWard, MD     [facility] Social History: She  reports that she has never smoked. She has never used smokeless tobacco. She reports that she does not drink alcohol or use drugs.  Family History: family history is not on file.   Review of Systems: A full review of systems was performed and negative except as noted in the HPI.     O:  BP (!) 96/55   Pulse 77   Temp 97.7 F (36.5 C) (Oral)   Resp 18  Results for orders placed or performed during the hospital encounter of 10/23/16 (from the past 48 hour(s))  Urine culture   Collection Time: 10/22/16 11:28 PM  Result Value Ref Range   Specimen Description URINE, RANDOM    Special Requests NONE    Culture [facility] GROWTH [facility]    Report Status 10/24/2016 FINAL   [place]   Collection Time: 10/22/16 11:28 PM  Result Value Ref Range   Color, Urine STRAW (A) YELLOW   APPearance CLEAR (A) CLEAR   Glucose, UA NEGATIVE NEGATIVE mg/dL   Bilirubin Urine NEGATIVE NEGATIVE   Ketones, ur NEGATIVE NEGATIVE mg/dL   Specific Gravity, Urine 1.002 (L) 1.005 - 1.030  Hgb urine dipstick 1+ (A) NEGATIVE   pH 7.0 5.0 - 8.0   Protein, ur NEGATIVE NEGATIVE mg/dL   Nitrite NEGATIVE NEGATIVE   Leukocytes, UA 1+ (A) NEGATIVE   RBC / HPF 0-5 0 - 5 RBC/hpf   WBC, UA 6-30 0 - 5 WBC/hpf   Bacteria, UA MANY (A) NONE SEEN   Squamous Epithelial / LPF 0-5 (A) NONE SEEN  CBC   Collection Time: 10/23/16  3:18 AM  Result Value Ref Range   WBC 14.3 (H) 3.6 - 11.0 K/uL   RBC 3.04 (L) 3.80 - 5.20 MIL/uL   Hemoglobin 9.2 (L) 12.0 - 16.0 g/dL   HCT 27.6 (L) 35.0 - 47.0 %   MCV 90.8 80.0 - 100.0 fL   MCH 30.3 26.0 - 34.0 pg   MCHC 33.3 32.0 - 36.0 g/dL   RDW  13.8 11.5 - 14.5 %   Platelets 241 150 - 440 K/uL  Comprehensive metabolic panel   Collection Time: 10/23/16  3:18 AM  Result Value Ref Range   Sodium 136 135 - 145 mmol/L   Potassium 3.8 3.5 - 5.1 mmol/L   Chloride 106 101 - 111 mmol/L   CO2 24 22 - 32 mmol/L   Glucose, Bld 89 65 - 99 mg/dL   BUN 8 6 - 20 mg/dL   Creatinine, Ser 0.32 (L) 0.44 - 1.00 mg/dL   Calcium 8.7 (L) 8.9 - 10.3 mg/dL   Total Protein 7.5 6.5 - 8.1 g/dL   Albumin 2.8 (L) 3.5 - 5.0 g/dL   AST 16 15 - 41 U/L   ALT 9 (L) 14 - 54 U/L   Alkaline Phosphatase 115 38 - 126 U/L   Total Bilirubin <0.1 (L) 0.3 - 1.2 mg/dL   GFR calc non Af Amer >60 >60 mL/min   GFR calc Af Amer >60 >60 mL/min   Anion gap 6 5 - 15     Constitutional: NAD, AAOx3  HE/ENT: extraocular movements grossly intact, moist mucous membranes CV: RRR PULM: nl respiratory effort, CTABL     Abd: gravid, non-tender, non-distended, soft      Ext: Non-tender, Nonedmeatous   Psych: mood appropriate, speech normal Pelvic: deferred  FHT:  140 mod + accels no decels TOCO: quiet    A/P:  30yo G1P0 @ 30.5 with kidney stone.   Labor: not present.   Fetal Wellbeing: Reassuring Cat 1 tracing.  Comfort cares until kidney stone passes.  IV Dilaudid immediately with IV zofran for nausea ppx (gets nauseated with codeine, throws up - not a true allergy), and PO percocet for home. Rx given.    D/c home stable, precautions reviewed, follow-up as scheduled.   ----- Larey Days, MD Attending Obstetrician and Gynecologist [facility], Department of [facility]

## 2016-10-24 NOTE — MAU Note (Signed)
Patient presents with c/o kidney stones. Patient states that she was seen today at [facility] and was given pain medication but has no relief.

## 2016-10-25 ENCOUNTER — Observation Stay (HOSPITAL_COMMUNITY): Payer: BLUE CROSS/BLUE SHIELD

## 2016-10-25 DIAGNOSIS — Z880 Allergy status to penicillin: Secondary | ICD-10-CM | POA: Diagnosis not present

## 2016-10-25 DIAGNOSIS — Z888 Allergy status to other drugs, medicaments and biological substances status: Secondary | ICD-10-CM | POA: Diagnosis not present

## 2016-10-25 DIAGNOSIS — O9989 Other specified diseases and conditions complicating pregnancy, childbirth and the puerperium: Secondary | ICD-10-CM | POA: Diagnosis present

## 2016-10-25 DIAGNOSIS — N23 Unspecified renal colic: Secondary | ICD-10-CM | POA: Diagnosis present

## 2016-10-25 DIAGNOSIS — Z79899 Other long term (current) drug therapy: Secondary | ICD-10-CM | POA: Diagnosis not present

## 2016-10-25 DIAGNOSIS — Z882 Allergy status to sulfonamides status: Secondary | ICD-10-CM | POA: Diagnosis not present

## 2016-10-25 DIAGNOSIS — N132 Hydronephrosis with renal and ureteral calculous obstruction: Secondary | ICD-10-CM | POA: Diagnosis present

## 2016-10-25 DIAGNOSIS — Z3A30 30 weeks gestation of pregnancy: Secondary | ICD-10-CM | POA: Diagnosis present

## 2016-10-25 DIAGNOSIS — Z8249 Family history of ischemic heart disease and other diseases of the circulatory system: Secondary | ICD-10-CM | POA: Diagnosis not present

## 2016-10-25 DIAGNOSIS — Z886 Allergy status to analgesic agent status: Secondary | ICD-10-CM | POA: Diagnosis not present

## 2016-10-25 NOTE — Progress Notes (Signed)
Pt c/o nausea, requesting Zofran.

## 2016-10-25 NOTE — Consult Note (Signed)
Urology Consult  Referring physician: Lawson Fiscal Reason for referral: Possible ureteral stone  Chief Complaint: Possible ureteral stone  History of Present Illness: Patient admitted Nov 27th with right flank pain; po pain medication not working well; urine negative and no blood; had a renal u/sound with mild to mod hydro and diminshed rt side ureteral jet; patient was hopeful to be seen in [place] by Urology;  Serum Cr normal; WBC 14.3 two days ago  Repeat renal u/sound: Unable to see ureteral jet on right and fluid around right kidney;   According to nursing pain well controlled and 4/10 severity; nausea treated once; straining urine with no stones  No stone history (Father with signif. Stone hx); no UTI history Right flank pain comes and goes as noted Modifying factors: There are no other modifying factors  Associated signs and symptoms: There are no other associated signs and symptoms Aggravating and relieving factors: There are no other aggravating or relieving factors Severity: Mild to Moderate Duration: Persistent     Past Medical History:  Diagnosis Date  . Anemia    with pregnancy  . Irritable bowel syndrome (IBS)    symptoms mostly diarrhea  . Medical history non-contributory    Past Surgical History:  Procedure Laterality Date  . NO PAST SURGERIES      Medications: I have reviewed the patient's current medications. Allergies:  Allergies  Allergen Reactions  . Ceclor [Cefaclor] Hives, Swelling and Other (See Comments)    Reaction:  Lip swelling   . Codeine Hives, Nausea And Vomiting, Swelling and Other (See Comments)    Reaction:  Lip swelling  . Ibuprofen Hives, Swelling and Other (See Comments)    Reaction:  Swelling of lips and hands   . Penicillins Hives, Swelling and Other (See Comments)    Reaction:  Swelling of lips and hands  Has patient had a PCN reaction causing immediate rash, facial/tongue/throat swelling, SOB or lightheadedness with  hypotension: Yes Has patient had a PCN reaction causing severe rash involving mucus membranes or skin necrosis: No Has patient had a PCN reaction that required hospitalization No Has patient had a PCN reaction occurring within the last 10 years: No If all of the above answers are "NO", then may proceed with Cephalosporin use.  . Sulfa Antibiotics Hives    Family History  Problem Relation Age of Onset  . Hypertension Mother    Social History:  reports that she has never smoked. She has never used smokeless tobacco. She reports that she does not drink alcohol or use drugs.  ROS: All systems are reviewed and negative except as noted. Rest negative  Physical Exam:  Vital signs in last 24 hours: Temp:  [97.7 F (36.5 C)-98.5 F (36.9 C)] 98.1 F (36.7 C) (11/29 1153) Pulse Rate:  [78-123] 85 (11/29 1153) Resp:  [17-21] 20 (11/29 1153) BP: (104-119)/(67-81) 113/73 (11/29 1153) SpO2:  [93 %-100 %] 98 % (11/29 1153) Weight:  [54 kg (119 lb)-54.9 kg (121 lb)] 54.9 kg (121 lb) (11/29 0930)  Cardiovascular: Skin warm; not flushed Respiratory: Breaths quiet; no shortness of breath Abdomen: No masses Neurological: Normal sensation to touch Musculoskeletal: Normal motor function arms and legs Lymphatics: No inguinal adenopathy Skin: No rashes Genitourinary:nontoxic; little to no CVA tenderness  Laboratory Data:  Results for orders placed or performed during the hospital encounter of 10/24/16 (from the past 72 hour(s))  Urinalysis, Routine w reflex microscopic (not at [facility])     Status: Abnormal   Collection Time: 10/24/16  3:39 PM  Result Value Ref Range   Color, Urine YELLOW YELLOW   APPearance CLEAR CLEAR   Specific Gravity, Urine <1.005 (L) 1.005 - 1.030   pH 5.5 5.0 - 8.0   Glucose, UA NEGATIVE NEGATIVE mg/dL   Hgb urine dipstick TRACE (A) NEGATIVE   Bilirubin Urine NEGATIVE NEGATIVE   Ketones, ur 40 (A) NEGATIVE mg/dL   Protein, ur NEGATIVE NEGATIVE mg/dL   Nitrite NEGATIVE  NEGATIVE   Leukocytes, UA SMALL (A) NEGATIVE  Urine microscopic-add on     Status: Abnormal   Collection Time: 10/24/16  3:39 PM  Result Value Ref Range   Squamous Epithelial / LPF 0-5 (A) NONE SEEN   WBC, UA 0-5 0 - 5 WBC/hpf   RBC / HPF NONE SEEN 0 - 5 RBC/hpf   Bacteria, UA FEW (A) NONE SEEN  Type and screen [place]     Status: None   Collection Time: 10/24/16  7:20 PM  Result Value Ref Range   ABO/RH(D) A POS    Antibody Screen NEG    Sample Expiration 10/27/2016   ABO/Rh     Status: None   Collection Time: 10/24/16  7:20 PM  Result Value Ref Range   ABO/RH(D) A POS    Recent Results (from the past 240 hour(s))  Urine culture     Status: None   Collection Time: 10/22/16 11:28 PM  Result Value Ref Range Status   Specimen Description URINE, RANDOM  Final   Special Requests NONE  Final   Culture NO GROWTH Performed at [facility]   Final   Report Status 10/24/2016 FINAL  Final   Creatinine:  Recent Labs  10/23/16 0318  CREATININE 0.32*    Xrays: See report/chart Reviewed   Impression/Assessment:  Right hydronephrosis; spoke with family in detail; no blood in urine ? stone  Plan:  Take one take at a time; if pain not controlled with po meds and gets fever we will order a CT stone protocol low dose study to see if stone present; only order if it would change management; if intervention needed would be treated with a stent; patient understood the ongoing options and decision process; will Call Dr Darnell Level and follow pt  Cina Klumpp A 10/25/2016, 12:24 PM

## 2016-10-25 NOTE — Progress Notes (Signed)
  Patient is feeling better this morning.  Feeling pressure and urine appears dark.  BP 111/74 (BP Location: Left Arm)   Pulse 90   Temp 97.9 F (36.6 C) (Oral)   Resp 18   Ht 5' 1"  (1.549 m)   Wt 54 kg (119 lb)   SpO2 95%   BMI 22.48 kg/m  Results for orders placed or performed during the hospital encounter of 10/24/16 (from the past 24 hour(s))  Urinalysis, Routine w reflex microscopic (not at [facility])     Status: Abnormal   Collection Time: 10/24/16  3:39 PM  Result Value Ref Range   Color, Urine YELLOW YELLOW   APPearance CLEAR CLEAR   Specific Gravity, Urine <1.005 (L) 1.005 - 1.030   pH 5.5 5.0 - 8.0   Glucose, UA NEGATIVE NEGATIVE mg/dL   Hgb urine dipstick TRACE (A) NEGATIVE   Bilirubin Urine NEGATIVE NEGATIVE   Ketones, ur 40 (A) NEGATIVE mg/dL   Protein, ur NEGATIVE NEGATIVE mg/dL   Nitrite NEGATIVE NEGATIVE   Leukocytes, UA SMALL (A) NEGATIVE  Urine microscopic-add on     Status: Abnormal   Collection Time: 10/24/16  3:39 PM  Result Value Ref Range   Squamous Epithelial / LPF 0-5 (A) NONE SEEN   WBC, UA 0-5 0 - 5 WBC/hpf   RBC / HPF NONE SEEN 0 - 5 RBC/hpf   Bacteria, UA FEW (A) NONE SEEN  Type and screen [place]     Status: None   Collection Time: 10/24/16  7:20 PM  Result Value Ref Range   ABO/RH(D) A POS    Antibody Screen NEG    Sample Expiration 10/27/2016   ABO/Rh     Status: None   Collection Time: 10/24/16  7:20 PM  Result Value Ref Range   ABO/RH(D) A POS    Abdomen is soft  No CVAT  IMPRESSION: IUP at 30 w 6 days Right renal colic  PLAN: Repeat Renal ultrasound If ultrasound unchanged will discharge home.  Patient requests to see her father's urologist in [place] - our office will arrange appointment.

## 2016-10-25 NOTE — Progress Notes (Signed)
Review of ultrasound Unable to see right ureteral jet Left jet seen. Perinephric fluid on right with right hydronephrosis  Requested Urology consult Dr. Matilde Sprang to see patient today

## 2016-10-26 MED ORDER — TERBUTALINE SULFATE 1 MG/ML IJ SOLN
0.2500 mg | Freq: Once | INTRAMUSCULAR | Status: DC
  Filled 2016-10-26: qty 1

## 2016-10-26 MED ORDER — NIFEDIPINE 10 MG PO CAPS
10.0000 mg | ORAL_CAPSULE | Freq: Four times a day (QID) | ORAL | Status: DC
  Administered 2016-10-26 – 2016-10-27 (×3): 10 mg via ORAL
  Filled 2016-10-26 (×3): qty 1

## 2016-10-26 NOTE — Progress Notes (Signed)
Patient has been have CTX on the monitor this afternoon and not feeling them.  She now reports feeling some lower pelvic pressure.    Will give terb x 1 and add procardia 10 q 6.  Linda Hedges, DO

## 2016-10-26 NOTE — Progress Notes (Signed)
Vitals normal  Pain bit worse this am/last night She would like to continue to follow conservatively Will speak to gyne and follow closely

## 2016-10-26 NOTE — Progress Notes (Signed)
Pain level 4-7 on PCA.  No current nausea.  No CTX.  Active FM.  VSS. AF. NST reactive  Gen: A&O x 3 Abd: Soft, NT Ext: no c/c/e  30yo G1 at 47w0dwith likely right nephrolithiasis -Continue pain control -Dr. MMatilde Sprangfollowing -IVF  MLinda Hedges DO

## 2016-10-27 DIAGNOSIS — N200 Calculus of kidney: Secondary | ICD-10-CM

## 2016-10-27 HISTORY — DX: Calculus of kidney: N20.0

## 2016-10-27 MED ORDER — OXYCODONE HCL 5 MG PO TABS
10.0000 mg | ORAL_TABLET | ORAL | Status: DC | PRN
  Administered 2016-10-27: 10 mg via ORAL
  Filled 2016-10-27: qty 2

## 2016-10-27 MED ORDER — ONDANSETRON HCL 4 MG/2ML IJ SOLN
4.0000 mg | Freq: Four times a day (QID) | INTRAMUSCULAR | Status: DC | PRN
  Administered 2016-10-27 – 2016-10-28 (×2): 4 mg via INTRAVENOUS
  Filled 2016-10-27: qty 2

## 2016-10-27 MED ORDER — HYDROMORPHONE HCL 2 MG PO TABS
2.0000 mg | ORAL_TABLET | ORAL | Status: DC | PRN
  Administered 2016-10-27 – 2016-10-28 (×4): 2 mg via ORAL
  Filled 2016-10-27 (×4): qty 1

## 2016-10-27 NOTE — Progress Notes (Signed)
Nurse and patient report pain much less Very encouraging I think OK to go home and treat with po pain meds No CT today unless things change

## 2016-10-27 NOTE — Progress Notes (Signed)
Switched to PO dilaudid 24m q 4-6 hours. Pt reports improved pain control but still uncomfortable. Remains AF and w/out any s/s of PTL or pyelonephritis. Will continue to observe overnight and anticipate d/c in AM.   ELucillie GarfinkelMD

## 2016-10-27 NOTE — Progress Notes (Signed)
S: Feeling much improved - did not press PCA often last night and slept most of night. Pain minimal this Am. No contractions, LOF or Vb. Denies nausea/emesis. +FM.   O: AFVSS Well appearing, NAD, conversive, smiling No CVA tenderness No uterine tenderness  A/P: 30 yo G1P0 @ 31.0wga w/likely R nephrolithiasis. Pain improved and w/minimal use of PCA overnight. Pt is AF and w/out any s/s of pyelo.  - d//c dPCA, transition to PO pain meds (oxycodone 27m q 4 hr) - d/c Nifedipine (no contractions) - Appreciate Urology recs. Per Dr. MMatilde Sprang okay for d/c - anticipate d/c later today  ELucillie GarfinkelMD

## 2016-10-28 ENCOUNTER — Encounter (HOSPITAL_COMMUNITY)
Admission: AD | Disposition: A | Discharge status: Home / Self Care | Payer: Self-pay | Source: Ambulatory Visit | Attending: Obstetrics and Gynecology

## 2016-10-28 ENCOUNTER — Inpatient Hospital Stay (HOSPITAL_COMMUNITY): Payer: BLUE CROSS/BLUE SHIELD | Admitting: Anesthesiology

## 2016-10-28 ENCOUNTER — Encounter (HOSPITAL_COMMUNITY): Payer: Self-pay | Admitting: Radiology

## 2016-10-28 ENCOUNTER — Inpatient Hospital Stay (HOSPITAL_COMMUNITY): Payer: BLUE CROSS/BLUE SHIELD

## 2016-10-28 HISTORY — PX: CYSTOSCOPY WITH URETEROSCOPY AND STENT PLACEMENT: SHX6377

## 2016-10-28 SURGERY — CYSTOURETEROSCOPY, WITH STENT INSERTION
Anesthesia: General | Site: Ureter | Laterality: Right

## 2016-10-28 MED ORDER — PROPOFOL 10 MG/ML IV BOLUS
INTRAVENOUS | Status: AC
  Filled 2016-10-28: qty 20

## 2016-10-28 MED ORDER — CIPROFLOXACIN IN D5W 400 MG/200ML IV SOLN
INTRAVENOUS | Status: AC
  Filled 2016-10-28: qty 200

## 2016-10-28 MED ORDER — FENTANYL CITRATE (PF) 100 MCG/2ML IJ SOLN: 25.0000 ug | INTRAMUSCULAR | Status: DC | PRN

## 2016-10-28 MED ORDER — HYDROMORPHONE HCL 2 MG PO TABS: 2.0000 mg | ORAL_TABLET | ORAL | 0 refills | Status: DC | PRN

## 2016-10-28 MED ORDER — PROPOFOL 10 MG/ML IV BOLUS
INTRAVENOUS | Status: DC | PRN
  Administered 2016-10-28: 90 mg via INTRAVENOUS

## 2016-10-28 MED ORDER — FENTANYL CITRATE (PF) 100 MCG/2ML IJ SOLN
INTRAMUSCULAR | Status: AC
  Filled 2016-10-28: qty 2

## 2016-10-28 MED ORDER — CIPROFLOXACIN IN D5W 400 MG/200ML IV SOLN
400.0000 mg | INTRAVENOUS | Status: AC
  Administered 2016-10-28: 400 mg via INTRAVENOUS
  Filled 2016-10-28: qty 200

## 2016-10-28 MED ORDER — HYDROMORPHONE HCL 1 MG/ML IJ SOLN
INTRAMUSCULAR | Status: AC
  Filled 2016-10-28: qty 1

## 2016-10-28 MED ORDER — SUCCINYLCHOLINE CHLORIDE 200 MG/10ML IV SOSY
PREFILLED_SYRINGE | INTRAVENOUS | Status: DC | PRN
  Administered 2016-10-28: 80 mg via INTRAVENOUS

## 2016-10-28 MED ORDER — LIDOCAINE 2% (20 MG/ML) 5 ML SYRINGE
INTRAMUSCULAR | Status: DC | PRN
  Administered 2016-10-28: 50 mg via INTRAVENOUS

## 2016-10-28 MED ORDER — SODIUM CHLORIDE 0.9 % IV SOLN: 250.0000 mL | INTRAVENOUS | Status: DC | PRN

## 2016-10-28 MED ORDER — PROMETHAZINE HCL 25 MG/ML IJ SOLN: 6.2500 mg | INTRAMUSCULAR | Status: DC | PRN

## 2016-10-28 MED ORDER — SODIUM CHLORIDE 0.9% FLUSH: 3.0000 mL | Freq: Two times a day (BID) | INTRAVENOUS | Status: DC

## 2016-10-28 MED ORDER — LACTATED RINGERS IV SOLN: INTRAVENOUS | Status: DC

## 2016-10-28 MED ORDER — OXYCODONE HCL ER 15 MG PO T12A: 15.0000 mg | EXTENDED_RELEASE_TABLET | Freq: Two times a day (BID) | ORAL | Status: DC

## 2016-10-28 MED ORDER — OXYCODONE HCL ER 10 MG PO T12A
10.0000 mg | EXTENDED_RELEASE_TABLET | Freq: Two times a day (BID) | ORAL | Status: DC
  Filled 2016-10-28: qty 1

## 2016-10-28 MED ORDER — SODIUM CHLORIDE 0.9 % IR SOLN
Status: DC | PRN
  Administered 2016-10-28: 3000 mL

## 2016-10-28 MED ORDER — ACETAMINOPHEN 325 MG PO TABS: 650.0000 mg | ORAL_TABLET | ORAL | Status: DC | PRN

## 2016-10-28 MED ORDER — ACETAMINOPHEN 325 MG PO TABS: 650.0000 mg | ORAL_TABLET | Freq: Four times a day (QID) | ORAL | Status: DC

## 2016-10-28 MED ORDER — FENTANYL CITRATE (PF) 100 MCG/2ML IJ SOLN
INTRAMUSCULAR | Status: DC | PRN
  Administered 2016-10-28: 25 ug via INTRAVENOUS
  Administered 2016-10-28: 50 ug via INTRAVENOUS
  Administered 2016-10-28: 25 ug via INTRAVENOUS

## 2016-10-28 MED ORDER — HYDROMORPHONE HCL 1 MG/ML IJ SOLN
0.5000 mg | INTRAMUSCULAR | Status: DC | PRN
  Administered 2016-10-28 (×2): 0.5 mg via INTRAVENOUS
  Filled 2016-10-28: qty 1

## 2016-10-28 MED ORDER — SODIUM CHLORIDE 0.9% FLUSH: 3.0000 mL | INTRAVENOUS | Status: DC | PRN

## 2016-10-28 MED ORDER — ACETAMINOPHEN 650 MG RE SUPP
650.0000 mg | RECTAL | Status: DC | PRN
  Filled 2016-10-28: qty 1

## 2016-10-28 MED ORDER — OXYCODONE HCL ER 15 MG PO T12A
15.0000 mg | EXTENDED_RELEASE_TABLET | Freq: Two times a day (BID) | ORAL | Status: DC
  Filled 2016-10-28: qty 1

## 2016-10-28 SURGICAL SUPPLY — 20 items
BAG URO CATCHER STRL LF (MISCELLANEOUS) ×2 IMPLANT
BASKET LASER NITINOL 1.9FR (BASKET) IMPLANT
BASKET STONE NCOMPASS (UROLOGICAL SUPPLIES) IMPLANT
CATH URET 5FR 28IN OPEN ENDED (CATHETERS) IMPLANT
CATH URET DUAL LUMEN 6-10FR 50 (CATHETERS) ×2 IMPLANT
CLOTH BEACON ORANGE TIMEOUT ST (SAFETY) ×2 IMPLANT
FIBER LASER FLEXIVA 1000 (UROLOGICAL SUPPLIES) IMPLANT
FIBER LASER FLEXIVA 365 (UROLOGICAL SUPPLIES) IMPLANT
FIBER LASER FLEXIVA 550 (UROLOGICAL SUPPLIES) IMPLANT
GLOVE SURG SS PI 8.0 STRL IVOR (GLOVE) IMPLANT
GOWN STRL REUS W/TWL XL LVL3 (GOWN DISPOSABLE) ×2 IMPLANT
GUIDEWIRE STR DUAL SENSOR (WIRE) ×2 IMPLANT
IV NS 1000ML (IV SOLUTION) ×1
IV NS 1000ML BAXH (IV SOLUTION) ×1 IMPLANT
IV NS IRRIG 3000ML ARTHROMATIC (IV SOLUTION) ×2 IMPLANT
MANIFOLD NEPTUNE II (INSTRUMENTS) ×2 IMPLANT
PACK CYSTO (CUSTOM PROCEDURE TRAY) ×2 IMPLANT
SHEATH ACCESS URETERAL 38CM (SHEATH) ×2 IMPLANT
SHEATH URET ACCESS 10/12FR (MISCELLANEOUS) IMPLANT
TUBING CONNECTING 10 (TUBING) ×2 IMPLANT

## 2016-10-28 NOTE — Progress Notes (Signed)
CT w/obstructing stone and right UVJ. Per Urology, plan to transfer to main hospital for R ureteroscopy, removal of stones and stents. Discussed plan w/patient. Discussed fetal monitoring pre and post procedure. She is NPO - nothing to eat since yesterday. All questions answered. Dil IV prn pain. Transfer to [facility]. Royston Sinner MD

## 2016-10-28 NOTE — Progress Notes (Addendum)
Patient ID: Alice Sherman, female   DOB: [birth year], 30 y.o.   MRN: [id number] Pt has had continued severe pain and a CT today shows an obstructing 30m RUVJ stone.  I have discussed this with Dr. LRoyston [facility]innerand [facility]   I have reviewed the CT and will interview and consent the patient on her arrival to the holding area.   BP 120/78 (BP Location: Left Arm)   Pulse 84   Temp 98.8 F (37.1 C) (Oral)   Resp 18   Ht 5' 1"  (1.549 m)   Wt 54.9 kg (121 lb)   SpO2 99%   BMI 22.86 kg/m    Imp: RUVJ stone with pain.  Plan: I am going to procedure with right ureteroscopic stone extraction and possible stent.   I have reviewed the risks of bleeding, infection, ureteral or bladder injury, need for secondary procedures, thrombotic events and anesthetic complications.   Pregnancy risks with anesthesia reviewed with Dr. LRoyston Sinner

## 2016-10-28 NOTE — Discharge Instructions (Signed)
CYSTOSCOPY HOME CARE INSTRUCTIONS  Activity: Rest for the remainder of the day.  Do not drive or operate equipment today.  You may resume normal activities in one to two days as instructed by your physician.   Meals: Drink plenty of liquids and eat light foods such as gelatin or soup this evening.  You may return to a normal meal plan tomorrow.  Return to Work: You may return to work in one to two days or as instructed by your physician.  Special Instructions / Symptoms: Call your physician if any of these symptoms occur:   -persistent or heavy bleeding  -bleeding which continues after first few urination  -large blood clots that are difficult to pass  -urine stream diminishes or stops completely  -fever equal to or higher than 101 degrees Farenheit.  -cloudy urine with a strong, foul odor  -severe pain  Females should always wipe from front to back after elimination.  You may feel some burning pain when you urinate.  This should disappear with time.  Applying moist heat to the lower abdomen or a hot tub bath may help relieve the pain. \  Please bring the stone to the office at f/u for analysis.  You could follow up with Dr. Hollice Espy in our [place] office or if as indicated you prefer to see Dr. Bernardo Heater, just make an appointment for after delivery.  It is important to try to drink 100oz of water daily to prevent stones.   Patient Signature:  ________________________________________________________  Nurse's Signature:  ________________________________________________________

## 2016-10-28 NOTE — Anesthesia Preprocedure Evaluation (Signed)
Anesthesia Evaluation  Patient identified by MRN, date of birth, ID band Patient awake    Reviewed: Allergy & Precautions, NPO status , Patient's Chart, lab work & pertinent test results  Airway Mallampati: II  TM Distance: >3 FB Neck ROM: Full    Dental  (+) Teeth Intact, Dental Advisory Given   Pulmonary neg pulmonary ROS,    breath sounds clear to auscultation       Cardiovascular negative cardio ROS   Rhythm:Regular Rate:Normal     Neuro/Psych negative neurological ROS  negative psych ROS   GI/Hepatic negative GI ROS, Neg liver ROS,   Endo/Other  negative endocrine ROS  Renal/GU Renal Stones  negative genitourinary   Musculoskeletal negative musculoskeletal ROS (+)   Abdominal   Peds negative pediatric ROS (+)  Hematology negative hematology ROS (+)   Anesthesia Other Findings   Reproductive/Obstetrics (+) Pregnancy                             Lab Results  Component Value Date   WBC 14.3 (H) 10/23/2016   HGB 9.2 (L) 10/23/2016   HCT 27.6 (L) 10/23/2016   MCV 90.8 10/23/2016   PLT 241 10/23/2016   Lab Results  Component Value Date   CREATININE 0.32 (L) 10/23/2016   BUN 8 10/23/2016   NA 136 10/23/2016   K 3.8 10/23/2016   CL 106 10/23/2016   CO2 24 10/23/2016   No results found for: INR, PROTIME   Anesthesia Physical Anesthesia Plan  ASA: II and emergent  Anesthesia Plan: General   Post-op Pain Management:    Induction: Intravenous  Airway Management Planned: Oral ETT  Additional Equipment:   Intra-op Plan:   Post-operative Plan: Extubation in OR  Informed Consent: I have reviewed the patients History and Physical, chart, labs and discussed the procedure including the risks, benefits and alternatives for the proposed anesthesia with the patient or authorized representative who has indicated his/her understanding and acceptance.   Dental advisory  given  Plan Discussed with: CRNA  Anesthesia Plan Comments:         Anesthesia Quick Evaluation

## 2016-10-28 NOTE — Progress Notes (Signed)
Pt transferred to [facility] via [facility] for procedure.  Pt A&Ox4, NAD.  Family accompanied pt.

## 2016-10-28 NOTE — Progress Notes (Signed)
S: Pain worsening. Required IV dil overnight. When sitting up feels pain migrating from R flank and into groin. However, it is so painful to sit up she lays down and the pain remains in her R flank. No contractions, LOF or Vb. Denies nausea/emesis. +FM.   O: AFVSS Well appearing, NAD but does appear to be in pain No CVA tenderness No uterine tenderness  A/P: 30 yo G1P0 @ 31.0wga w/likely R nephrolithiasis. Pain not well c/w PO dilaudid. Oxycodone nto helpful for her pain. Pain c/s for further recs for PO pain control - pt prefers to not go back on PCA.  Discussed CT scan for confirmation of stone and evaluation if needs surgical intervention/stent per Urology. She is AF and w/out any s/s of pyelo. No s/s of PTL.    Lucillie Garfinkel MD

## 2016-10-28 NOTE — Progress Notes (Signed)
Spoke with Dr. Royston Sinner. Pt  Has a category 1 tracing with no uc's. No vaginal bleeding or leaking of fluid. Okay to d/c pt home.

## 2016-10-28 NOTE — Anesthesia Procedure Notes (Signed)
Procedure Name: Intubation Date/Time: 10/28/2016 2:45 PM Performed by: Anne Fu Pre-anesthesia Checklist: Patient identified, Emergency Drugs available, Suction available, Patient being monitored and Timeout performed Patient Re-evaluated:Patient Re-evaluated prior to inductionOxygen Delivery Method: Circle system utilized Preoxygenation: Pre-oxygenation with 100% oxygen Intubation Type: IV induction, Cricoid Pressure applied and Rapid sequence Laryngoscope Size: Mac and 4 Grade View: Grade I Tube type: Oral Tube size: 7.0 mm Number of attempts: 1 Airway Equipment and Method: Stylet Placement Confirmation: ETT inserted through vocal cords under direct vision,  positive ETCO2,  CO2 detector and breath sounds checked- equal and bilateral Secured at: 19 cm Tube secured with: Tape Dental Injury: Teeth and Oropharynx as per pre-operative assessment

## 2016-10-28 NOTE — Anesthesia Postprocedure Evaluation (Signed)
Anesthesia Post Note  Patient: Alice Sherman  Procedure(s) Performed: Procedure(s) (LRB): CYSTOSCOPY WITH RIGHT URETEROSCOPY (Right)  Patient location during evaluation: PACU Anesthesia Type: General Level of consciousness: awake Pain management: pain level controlled Vital Signs Assessment: post-procedure vital signs reviewed and stable Cardiovascular status: stable Anesthetic complications: no    Last Vitals:  Vitals:   10/28/16 1645 10/28/16 1651  BP: 121/83 111/82  Pulse: 92 90  Resp: 16 19  Temp:  36.8 C    Last Pain:  Vitals:   10/28/16 1545  TempSrc:   PainSc: 0-No pain                 EDWARDS,Romyn Boswell

## 2016-10-28 NOTE — Transfer of Care (Signed)
Immediate Anesthesia Transfer of Care Note  Patient: Alice Sherman  Procedure(s) Performed: Procedure(s): CYSTOSCOPY WITH RIGHT URETEROSCOPY (Right)  Patient Location: PACU  Anesthesia Type:General  Level of Consciousness:  sedated, patient cooperative and responds to stimulation  Airway & Oxygen Therapy:Patient Spontanous Breathing and Patient connected to face mask oxgen  Post-op Assessment:  Report given to PACU RN and Post -op Vital signs reviewed and stable  Post vital signs:  Reviewed and stable  Last Vitals:  Vitals:   10/28/16 0757 10/28/16 1200  BP:  120/78  Pulse:  84  Resp: 20 18  Temp: 36.9 C 89.2 C    Complications: No apparent anesthesia complications

## 2016-10-28 NOTE — Brief Op Note (Signed)
10/24/2016 - 10/28/2016  3:05 PM  PATIENT:  Alice Sherman  30 y.o. female  PRE-OPERATIVE DIAGNOSIS:  right distal stone  POST-OPERATIVE DIAGNOSIS:  right distal stone  PROCEDURE:  Procedure(s): CYSTOSCOPY WITH RIGHT URETEROSCOPY (Right)  BLADDER STONE REMOVAL.  SURGEON:  Surgeon(s) and Role:    * Irine Seal, MD - Primary  PHYSICIAN ASSISTANT:   ASSISTANTS: none   ANESTHESIA:   general  EBL:  No intake/output data recorded.  BLOOD ADMINISTERED:none  DRAINS: none   LOCAL MEDICATIONS USED:  NONE  SPECIMEN:  Source of Specimen:  stone  DISPOSITION OF SPECIMEN:  to family to bring to office.   COUNTS:  YES  TOURNIQUET:  * No tourniquets in log *  DICTATION: .Other Dictation: Dictation Number [id number]  PLAN OF CARE: Discharge to home after PACU  PATIENT DISPOSITION:  PACU - hemodynamically stable.   Delay start of Pharmacological VTE agent (>24hrs) due to surgical blood loss or risk of bleeding: not applicable

## 2016-10-28 NOTE — Progress Notes (Signed)
Mcneil Sober, RN, Women's OB/L&D/Rapid Response Nurse, at bedside for all fetal monitoring.

## 2016-10-29 NOTE — Op Note (Signed)
NAME:  Alice Sherman, Alice Sherman                    ACCOUNT NO.:  [id number]  MEDICAL RECORD NO.:  [id number]  LOCATION:  9156                          FACILITY:  [facility]  PHYSICIAN:  Marshall Cork. Jeffie Pollock, M.D.    DATE OF BIRTH:  05/13/1986  DATE OF PROCEDURE:  10/28/2016 DATE OF DISCHARGE:                              OPERATIVE REPORT   Patient of Dr. Dian Queen.  PROCEDURE:  Cystoscopy with simple removal of bladder stone and right ureteroscopy.  PREOPERATIVE DIAGNOSIS:  Right distal ureteral stone.  POSTOPERATIVE DIAGNOSIS:  The stone had passed into the bladder with no residual ureteral stones.  SURGEON:  Dr. Irine Seal.  ANESTHESIA:  General.  SPECIMEN:  Stone.  DRAINS:  None.  BLOOD LOSS:  None.  COMPLICATIONS:  None.  INDICATIONS:  [name] is a 30 year old 30-week pregnant female, who has had over a week of right flank pain.  It was felt to be from a stone.  She had a CT scan done this morning because of progressive pain that showed a 4 mm lead stone with smaller stones proximal.  After reviewing the options, she would like to have the stone removed today.  FINDINGS OF PROCEDURE:  She was taken to the operating room where she was given Cipro.  A general anesthetic was induced.  She was placed in lithotomy position.  Her perineum and genitalia were prepped with Betadine solution.  She was draped in usual sterile fashion.  Cystoscopy was performed using the 23-French scope and 30-degree lens. Examination revealed a normal urethra.  The bladder wall was primarily smooth and pale without tumors noted.  The left ureteral orifice was unremarkable.  The right ureteral orifice had some erythema and edema consistent with recent stone irritation and adjacent to the orifice were 2 or 3 pale tannish structures felt to be consistent with matrix or stones.  These were evacuated from the bladder and one of them was consistent with a 4 mm stone.  The others were more matrix-like material.  Because  she has been noted to have possibly multiple stones on the CT scan, I passed a guidewire gently without fluoroscopy and there was no further efflux of urine or stone material with this.  A gentle passage of the 4-1/2 [place] ureteroscope was then made and the absence of residual calculi was confirmed.  At this point, the wire was removed. It was not felt that a stent was indicated.  The bladder was drained. The patient was taken down from the lithotomy position.  Her anesthetic was reversed and she was moved to recovery room in stable condition. There were no complications.  Her family was given the stone and she will bring that for analysis at followup.     Marshall Cork. Jeffie Pollock, M.D.     JJW/MEDQ  D:  10/28/2016  T:  10/29/2016  Job:  [id number]

## 2016-10-30 ENCOUNTER — Encounter (HOSPITAL_COMMUNITY): Payer: Self-pay | Admitting: Urology

## 2016-11-03 NOTE — Discharge Summary (Signed)
72 yp G1P0 presented with kidney stone. Pain was treated with dPCA. She underwent CT scan which was notable for a stone in the UVJ and was ultimately transferred to the main hospital for stone removal. The removal was reportedly uncomplicated and she was discharged from the PACU. Home in stable condition after reactive NST.  Arty Baumgartner MD

## 2016-11-27 NOTE — L&D Delivery Note (Signed)
Delivery Note At 4:15 PM a viable female was delivered via Vaginal, Spontaneous Delivery (Presentation: LOA;  ).  APGAR: 9, 10; weight pending.   Placenta status: Routine .  Cord: Normal  with the following complications: None .  Cord pH: sent  Anesthesia:   Episiotomy: Right Mediolateral Lacerations: 2nd degree Suture Repair: 2.0 3.0 vicryl Est. Blood Loss (mL): 250  Mom to postpartum.  Baby to Couplet care / Skin to Skin.  Tyson Dense 12/24/2016, 5:19 PM

## 2016-12-01 LAB — OB RESULTS CONSOLE GBS: GBS: NEGATIVE

## 2016-12-24 ENCOUNTER — Inpatient Hospital Stay (HOSPITAL_COMMUNITY): Payer: BLUE CROSS/BLUE SHIELD | Admitting: Anesthesiology

## 2016-12-24 ENCOUNTER — Inpatient Hospital Stay (HOSPITAL_COMMUNITY)
Admission: AD | Admit: 2016-12-24 | Discharge: 2016-12-26 | DRG: 775 | Disposition: A | Discharge status: Home / Self Care | Payer: BLUE CROSS/BLUE SHIELD | Source: Ambulatory Visit | Attending: Obstetrics and Gynecology | Admitting: Obstetrics and Gynecology

## 2016-12-24 ENCOUNTER — Encounter (HOSPITAL_COMMUNITY): Payer: Self-pay | Admitting: Certified Nurse Midwife

## 2016-12-24 DIAGNOSIS — O4292 Full-term premature rupture of membranes, unspecified as to length of time between rupture and onset of labor: Secondary | ICD-10-CM | POA: Diagnosis present

## 2016-12-24 DIAGNOSIS — Z3A39 39 weeks gestation of pregnancy: Secondary | ICD-10-CM | POA: Diagnosis not present

## 2016-12-24 DIAGNOSIS — D649 Anemia, unspecified: Secondary | ICD-10-CM | POA: Diagnosis present

## 2016-12-24 DIAGNOSIS — O9902 Anemia complicating childbirth: Secondary | ICD-10-CM | POA: Diagnosis present

## 2016-12-24 DIAGNOSIS — O4290 Premature rupture of membranes, unspecified as to length of time between rupture and onset of labor, unspecified weeks of gestation: Secondary | ICD-10-CM

## 2016-12-24 LAB — CBC
HCT: 35.6 % — ABNORMAL LOW (ref 36.0–46.0)
Hemoglobin: 12.2 g/dL (ref 12.0–15.0)
MCH: 31 pg (ref 26.0–34.0)
MCHC: 34.3 g/dL (ref 30.0–36.0)
MCV: 90.4 fL (ref 78.0–100.0)
Platelets: 222 10*3/uL (ref 150–400)
RBC: 3.94 MIL/uL (ref 3.87–5.11)
RDW: 14.9 % (ref 11.5–15.5)
WBC: 10.4 10*3/uL (ref 4.0–10.5)

## 2016-12-24 LAB — TYPE AND SCREEN
ABO/RH(D): A POS
Antibody Screen: NEGATIVE

## 2016-12-24 LAB — POCT FERN TEST: POCT Fern Test: POSITIVE

## 2016-12-24 LAB — RPR: RPR Ser Ql: NONREACTIVE

## 2016-12-24 MED ORDER — PHENYLEPHRINE 40 MCG/ML (10ML) SYRINGE FOR IV PUSH (FOR BLOOD PRESSURE SUPPORT)
80.0000 ug | PREFILLED_SYRINGE | INTRAVENOUS | Status: DC | PRN
  Filled 2016-12-24: qty 10
  Filled 2016-12-24: qty 5

## 2016-12-24 MED ORDER — LACTATED RINGERS IV SOLN: 500.0000 mL | INTRAVENOUS | Status: DC | PRN

## 2016-12-24 MED ORDER — PRENATAL MULTIVITAMIN CH
1.0000 | ORAL_TABLET | Freq: Every day | ORAL | Status: DC
  Administered 2016-12-25 – 2016-12-26 (×2): 1 via ORAL
  Filled 2016-12-24 (×2): qty 1

## 2016-12-24 MED ORDER — FENTANYL 2.5 MCG/ML BUPIVACAINE 1/10 % EPIDURAL INFUSION (WH - ANES)
14.0000 mL/h | INTRAMUSCULAR | Status: DC | PRN
  Administered 2016-12-24 (×2): 14 mL/h via EPIDURAL
  Filled 2016-12-24 (×2): qty 100

## 2016-12-24 MED ORDER — HYDROCODONE-ACETAMINOPHEN 5-325 MG PO TABS: 1.0000 | ORAL_TABLET | Freq: Four times a day (QID) | ORAL | Status: DC | PRN

## 2016-12-24 MED ORDER — LIDOCAINE HCL (PF) 1 % IJ SOLN
30.0000 mL | INTRAMUSCULAR | Status: DC | PRN
  Filled 2016-12-24: qty 30

## 2016-12-24 MED ORDER — TETANUS-DIPHTH-ACELL PERTUSSIS 5-2.5-18.5 LF-MCG/0.5 IM SUSP: 0.5000 mL | Freq: Once | INTRAMUSCULAR | Status: DC

## 2016-12-24 MED ORDER — ONDANSETRON HCL 4 MG/2ML IJ SOLN: 4.0000 mg | Freq: Four times a day (QID) | INTRAMUSCULAR | Status: DC | PRN

## 2016-12-24 MED ORDER — FENTANYL CITRATE (PF) 100 MCG/2ML IJ SOLN
100.0000 ug | INTRAMUSCULAR | Status: DC | PRN
  Administered 2016-12-24: 100 ug via INTRAVENOUS
  Filled 2016-12-24: qty 2

## 2016-12-24 MED ORDER — ACETAMINOPHEN 325 MG PO TABS
650.0000 mg | ORAL_TABLET | ORAL | Status: DC | PRN
  Administered 2016-12-24 – 2016-12-26 (×8): 650 mg via ORAL
  Filled 2016-12-24 (×8): qty 2

## 2016-12-24 MED ORDER — OXYTOCIN BOLUS FROM INFUSION
500.0000 mL | Freq: Once | INTRAVENOUS | Status: AC
  Administered 2016-12-24: 500 mL via INTRAVENOUS

## 2016-12-24 MED ORDER — OXYTOCIN 40 UNITS IN LACTATED RINGERS INFUSION - SIMPLE MED: 2.5000 [IU]/h | INTRAVENOUS | Status: DC

## 2016-12-24 MED ORDER — LACTATED RINGERS IV SOLN
500.0000 mL | Freq: Once | INTRAVENOUS | Status: AC
  Administered 2016-12-24: 500 mL via INTRAVENOUS

## 2016-12-24 MED ORDER — BENZOCAINE-MENTHOL 20-0.5 % EX AERO
1.0000 "application " | INHALATION_SPRAY | CUTANEOUS | Status: DC | PRN
  Administered 2016-12-24: 1 via TOPICAL
  Filled 2016-12-24: qty 56

## 2016-12-24 MED ORDER — LACTATED RINGERS IV SOLN
INTRAVENOUS | Status: DC
  Administered 2016-12-24 (×3): via INTRAVENOUS

## 2016-12-24 MED ORDER — ACETAMINOPHEN 325 MG PO TABS: 650.0000 mg | ORAL_TABLET | ORAL | Status: DC | PRN

## 2016-12-24 MED ORDER — DIPHENHYDRAMINE HCL 25 MG PO CAPS: 25.0000 mg | ORAL_CAPSULE | Freq: Four times a day (QID) | ORAL | Status: DC | PRN

## 2016-12-24 MED ORDER — ZOLPIDEM TARTRATE 5 MG PO TABS: 5.0000 mg | ORAL_TABLET | Freq: Every evening | ORAL | Status: DC | PRN

## 2016-12-24 MED ORDER — ONDANSETRON HCL 4 MG PO TABS: 4.0000 mg | ORAL_TABLET | ORAL | Status: DC | PRN

## 2016-12-24 MED ORDER — SOD CITRATE-CITRIC ACID 500-334 MG/5ML PO SOLN: 30.0000 mL | ORAL | Status: DC | PRN

## 2016-12-24 MED ORDER — SIMETHICONE 80 MG PO CHEW: 80.0000 mg | CHEWABLE_TABLET | ORAL | Status: DC | PRN

## 2016-12-24 MED ORDER — ONDANSETRON HCL 4 MG/2ML IJ SOLN: 4.0000 mg | INTRAMUSCULAR | Status: DC | PRN

## 2016-12-24 MED ORDER — EPHEDRINE 5 MG/ML INJ
10.0000 mg | INTRAVENOUS | Status: DC | PRN
  Filled 2016-12-24: qty 4

## 2016-12-24 MED ORDER — DIBUCAINE 1 % RE OINT: 1.0000 "application " | TOPICAL_OINTMENT | RECTAL | Status: DC | PRN

## 2016-12-24 MED ORDER — DIPHENHYDRAMINE HCL 50 MG/ML IJ SOLN: 12.5000 mg | INTRAMUSCULAR | Status: DC | PRN

## 2016-12-24 MED ORDER — PHENYLEPHRINE 40 MCG/ML (10ML) SYRINGE FOR IV PUSH (FOR BLOOD PRESSURE SUPPORT)
80.0000 ug | PREFILLED_SYRINGE | INTRAVENOUS | Status: DC | PRN
  Filled 2016-12-24: qty 5

## 2016-12-24 MED ORDER — SENNOSIDES-DOCUSATE SODIUM 8.6-50 MG PO TABS
2.0000 | ORAL_TABLET | ORAL | Status: DC
  Administered 2016-12-25 (×2): 2 via ORAL
  Filled 2016-12-24 (×2): qty 2

## 2016-12-24 MED ORDER — COCONUT OIL OIL
1.0000 "application " | TOPICAL_OIL | Status: DC | PRN
  Administered 2016-12-26: 1 via TOPICAL
  Filled 2016-12-24: qty 120

## 2016-12-24 MED ORDER — OXYTOCIN 40 UNITS IN LACTATED RINGERS INFUSION - SIMPLE MED
1.0000 m[IU]/min | INTRAVENOUS | Status: DC
Start: 2016-12-24 — End: 2016-12-24
  Administered 2016-12-24: 2 m[IU]/min via INTRAVENOUS
  Filled 2016-12-24: qty 1000

## 2016-12-24 MED ORDER — WITCH HAZEL-GLYCERIN EX PADS
1.0000 "application " | MEDICATED_PAD | CUTANEOUS | Status: DC | PRN
  Administered 2016-12-25: 1 via TOPICAL

## 2016-12-24 MED ORDER — FLEET ENEMA 7-19 GM/118ML RE ENEM: 1.0000 | ENEMA | RECTAL | Status: DC | PRN

## 2016-12-24 MED ORDER — LIDOCAINE HCL (PF) 1 % IJ SOLN
INTRAMUSCULAR | Status: DC | PRN
  Administered 2016-12-24: 5 mL via EPIDURAL
  Administered 2016-12-24: 3 mL

## 2016-12-24 MED ORDER — TERBUTALINE SULFATE 1 MG/ML IJ SOLN
0.2500 mg | Freq: Once | INTRAMUSCULAR | Status: DC | PRN
  Filled 2016-12-24: qty 1

## 2016-12-24 NOTE — Progress Notes (Signed)
C/c/+2, will start to push. Anticipate NSVD. EL MD

## 2016-12-24 NOTE — Anesthesia Preprocedure Evaluation (Signed)

## 2016-12-24 NOTE — Anesthesia Pain Management Evaluation Note (Signed)
  CRNA Pain Management Visit Note  Patient: Alice Sherman, 31 y.o., female  "Hello I am a member of the anesthesia team at [facility]. We have an anesthesia team available at all times to provide care throughout the hospital, including epidural management and anesthesia for C-section. I don't know your plan for the delivery whether it a natural birth, water birth, IV sedation, nitrous supplementation, doula or epidural, but we want to meet your pain goals."   1.Was your pain managed to your expectations on prior hospitalizations?   No prior hospitalizations  2.What is your expectation for pain management during this hospitalization?     Epidural  3.How can we help you reach that goal?  Continue Epidural  Record the patient's initial score and the patient's pain goal.   Pain: 2  Pain Goal: 2 [facility] wants you to be able to say your pain was always managed very well.  Alice Sherman 12/24/2016

## 2016-12-24 NOTE — MAU Provider Note (Signed)
History   [id number]   Chief Complaint  Patient presents with  . Labor Eval    HPI Alice Sherman is a 31 y.o. female  G1P0 here with report of watery vaginal discharge that began at approximately 0130 this morning.  Leaking of fluid has continued.  Pt reports contractions and denies vaginal bleeding. +fetal movement.   All other systems negative.    No LMP recorded. Patient is pregnant.  OB History  Gravida Para Term Preterm AB Living  1            SAB TAB Ectopic Multiple Live Births               # Outcome Date GA Lbr Len/2nd Weight Sex Delivery Anes PTL Lv  1 Current               Past Medical History:  Diagnosis Date  . Anemia    with pregnancy  . Irritable bowel syndrome (IBS)    symptoms mostly diarrhea  . Medical history non-contributory     Family History  Problem Relation Age of Onset  . Hypertension Mother     Social History   Social History  . Marital status: Married    Spouse name: N/A  . Number of children: N/A  . Years of education: N/A   Social History Main Topics  . Smoking status: Never Smoker  . Smokeless tobacco: Never Used  . Alcohol use No  . Drug use: No  . Sexual activity: No   Other Topics Concern  . None   Social History Narrative  . None    Allergies  Allergen Reactions  . Ceclor [Cefaclor] Hives, Swelling and Other (See Comments)    Reaction:  Lip swelling   . Codeine Hives, Nausea And Vomiting, Swelling and Other (See Comments)    Reaction:  Lip swelling  . Ibuprofen Hives, Swelling and Other (See Comments)    Reaction:  Swelling of lips and hands   . Penicillins Hives, Swelling and Other (See Comments)    Reaction:  Swelling of lips and hands  Has patient had a PCN reaction causing immediate rash, facial/tongue/throat swelling, SOB or lightheadedness with hypotension: Yes Has patient had a PCN reaction causing severe rash involving mucus membranes or skin necrosis: No Has patient had a PCN reaction that required  hospitalization No Has patient had a PCN reaction occurring within the last 10 years: No If all of the above answers are "NO", then may proceed with Cephalosporin use.  . Sulfa Antibiotics Hives    No current facility-administered medications on file prior to encounter.    No current outpatient prescriptions on file prior to encounter.     Review of Systems  Gastrointestinal: Positive for abdominal pain.  Genitourinary: Positive for vaginal discharge. Negative for vaginal bleeding.     Physical Exam   Vitals:   12/24/16 0301  BP: 130/88  Pulse: 96  Resp: 16  Temp: 97.6 F (36.4 C)  TempSrc: Oral    Physical Exam  Nursing note and vitals reviewed. Constitutional: She is oriented to person, place, and time. She appears well-developed and well-nourished. No distress.  HENT:  Head: Normocephalic and atraumatic.  Eyes: Conjunctivae are normal. Right eye exhibits no discharge. Left eye exhibits no discharge. No scleral icterus.  Neck: Normal range of motion.  Respiratory: Effort normal. No respiratory distress.  Genitourinary:  Genitourinary Comments: + pooling, moderate amount of clear fluid  Neurological: She is alert and oriented to person, place,  and time.  Skin: Skin is warm and dry. She is not diaphoretic.  Psychiatric: She has a normal mood and affect. Her behavior is normal. Judgment and thought content normal.   Dilation: 1.5 Effacement (%): 50 Cervical Position: Posterior Station: -3 Presentation: Vertex Exam by:: Wende Bushy RN   Fetal Tracing:  Baseline: 125 Variability: moderate Accelerations: 15x15 Decelerations: early  Toco: 3-5 mins MAU Course  Procedures Results for orders placed or performed during the hospital encounter of 12/24/16 (from the past 24 hour(s))  POCT fern test     Status: None   Collection Time: 12/24/16  3:50 AM  Result Value Ref Range   POCT Fern Test Positive = ruptured amniotic membanes     MDM Category 1 tracing +  pooling, + fern  Assessment and Plan  A: 1. Amniotic fluid leaking    P: RN to call Dr. Royston Sinner for further orders   Jorje Guild, NP 12/24/2016 3:48 AM

## 2016-12-24 NOTE — MAU Note (Signed)
Patient presents to MAU with c/o contractions. States water broke around 0145 and contraction began shortly after that. Denies bleeding. +FM.

## 2016-12-24 NOTE — Lactation Note (Signed)
This note was copied from a baby's chart. Lactation Consultation Note  Patient Name: Alice Sherman [id number] Date: 12/24/2016 Reason for consult: Initial assessment;Infant < 6lbs  Baby 2 hours old. Mom reports that baby latched well just after birth, but has not wanted to latch since. Discussed newborn behavior, and enc lots of STS and nursing with cues. Assisted parents with hand expression and baby spoon and finger-fed 3 ml of EBM. Enc mom to nurse with cues, and then hand express and spoon-feed EBM if baby not latching/nursing well or if mom wanting to make sure baby getting a little extra while she is learning to nurse. Enc parents to feed with cues and then at least every 3 hours d/t baby's weight. Enc parents to watch baby for tiring at the breast.  Discussed progression of milk coming to volume and cluster-feeding.   Maternal Data Has patient been taught Hand Expression?: Yes Does the patient have breastfeeding experience prior to this delivery?: No  Feeding Feeding Type: Breast Fed Length of feed: 0 min  LATCH Score/Interventions Latch: Too sleepy or reluctant, no latch achieved, no sucking elicited. Intervention(s): Skin to skin;Teach feeding cues;Waking techniques Intervention(s): Adjust position;Assist with latch;Breast compression  Audible Swallowing: None Intervention(s): Skin to skin;Hand expression  Type of Nipple: Everted at rest and after stimulation (Small, round nipples--right a little larger than left. )  Comfort (Breast/Nipple): Soft / non-tender     Hold (Positioning): Assistance needed to correctly position infant at breast and maintain latch. Intervention(s): Breastfeeding basics reviewed;Support Pillows;Position options;Skin to skin  LATCH Score: 5  Lactation Tools Discussed/Used     Consult Status      Andres Labrum 12/24/2016, 8:13 PM

## 2016-12-24 NOTE — Progress Notes (Addendum)
S:  Requesting cle.   O:  Vitals  Vitals:   12/24/16 0850 12/24/16 0900  BP: 117/72 124/81  Pulse: 79 83  Resp: 17 16  Temp:       Gen: NAD SVE: 4/80/-2 FHT: cat 1  Toco: q 2-4 min  A/P: Pt is a G1P0 @39 .3wga here w/SROM and in labor.  S/p SROM Expectant manag't, pit prn GBS neg  Lucillie Garfinkel MD

## 2016-12-24 NOTE — Anesthesia Procedure Notes (Addendum)
Epidural Patient location during procedure: OB  Staffing Anesthesiologist: Lyndle Herrlich  Preanesthetic Checklist Completed: patient identified, site marked, surgical consent, pre-op evaluation, timeout performed, IV checked, risks and benefits discussed and monitors and equipment checked  Epidural Patient position: sitting Prep: site prepped and draped and DuraPrep Patient monitoring: continuous pulse ox and blood pressure Approach: midline Location: L2-L3 Injection technique: LOR air  Needle:  Needle type: Tuohy  Needle gauge: 17 G Needle length: 9 cm and 9 Needle insertion depth: 4 cm Catheter type: closed end flexible Catheter size: 19 Gauge Catheter at skin depth: 10 cm Test dose: negative  Assessment Events: blood not aspirated, injection not painful, no injection resistance, negative IV test and no paresthesia  Additional Notes Dosing of Epidural:  1st dose, through catheter ............................................Marland Kitchen Xylocaine 30 mg  2nd dose, through catheter, after waiting 3 minutes.......Marland KitchenXylocaine 50 mg     As each dose occurred, patient was free of IV sx; and patient exhibited no evidence of SA injection.  Patient is more comfortable after epidural dosed. Please see RN's note for documentation of vital signs,and FHR which are stable.  Patient reminded not to try to ambulate with numb legs, and that an RN must be present the 1st time she attempts to get up.

## 2016-12-24 NOTE — H&P (Signed)
Alice Sherman is a 31 y.o. female presenting for SROM. OB History    Gravida Para Term Preterm AB Living   1             SAB TAB Ectopic Multiple Live Births                 Past Medical History:  Diagnosis Date  . Anemia    with pregnancy  . Irritable bowel syndrome (IBS)    symptoms mostly diarrhea  . Medical history non-contributory    Past Surgical History:  Procedure Laterality Date  . CYSTOSCOPY WITH URETEROSCOPY AND STENT PLACEMENT Right 10/28/2016   Procedure: CYSTOSCOPY WITH RIGHT URETEROSCOPY;  Surgeon: Irine Seal, MD;  Location: [facility];  Service: Urology;  Laterality: Right;  . NO PAST SURGERIES     Family History: family history includes Hypertension in her mother. Social History:  reports that she has never smoked. She has never used smokeless tobacco. She reports that she does not drink alcohol or use drugs.     Maternal Diabetes: No Genetic Screening: Normal Maternal Ultrasounds/Referrals: Normal Fetal Ultrasounds or other Referrals:  None Maternal Substance Abuse:  No Significant Maternal Medications:  None Significant Maternal Lab Results:  None Other Comments:  None  ROS History Dilation: 1.5 Effacement (%): 70 Station: -2 Exam by:: [email or web address]-[email or web address], rnc  Blood pressure 111/65, pulse (!) 102, temperature 97.5 F (36.4 C), temperature source Oral, resp. rate 16, height 5' 1"  (1.549 m), weight 59.4 kg (131 lb). Exam Physical Exam  Gen: NAD, well appearing Cv: red rate Pulm: NWOB Abd: nontender, nondistended,  Gravid  Prenatal labs: ABO, Rh: --/--/A POS (01/28 5176) Antibody: NEG (01/28 0416) Rubella:   RPR:   NR HBsAg:   NR HIV:   NR GBS:   neg  Assessment/Plan: 30yo G1P0 @39 .3wga p/w SROM. H/o kidney stones. Plan for pitocin. GBS neg.   Tyson Dense 12/24/2016, 7:33 AM

## 2016-12-25 LAB — CBC
HCT: 30.5 % — ABNORMAL LOW (ref 36.0–46.0)
Hemoglobin: 10.4 g/dL — ABNORMAL LOW (ref 12.0–15.0)
MCH: 31 pg (ref 26.0–34.0)
MCHC: 34.1 g/dL (ref 30.0–36.0)
MCV: 91 fL (ref 78.0–100.0)
Platelets: 185 10*3/uL (ref 150–400)
RBC: 3.35 MIL/uL — ABNORMAL LOW (ref 3.87–5.11)
RDW: 15.3 % (ref 11.5–15.5)
WBC: 12.2 10*3/uL — ABNORMAL HIGH (ref 4.0–10.5)

## 2016-12-25 NOTE — Lactation Note (Signed)
This note was copied from a baby's chart. Lactation Consultation Note  Patient Name: [name] [id number] Date: 12/25/2016 Reason for consult: Follow-up assessment Mom called out for latch assist.  Baby awake and showing feeding cues.  Assisted with positioning baby in cross cradle on left breast.  Baby attempted to latch several times but unable to grasp breast.  The left nipple is smaller than right.  Positioned baby on right and baby was able to latch and sustain latch.  Baby becomes sleepy easily and needs good stimulation to actively feed.  Encouraged to call out for further assist.  Maternal Data    Feeding Feeding Type: Breast Fed Length of feed: 20 min  LATCH Score/Interventions Latch: Grasps breast easily, tongue down, lips flanged, rhythmical sucking. Intervention(s): Skin to skin;Teach feeding cues;Waking techniques Intervention(s): Breast compression;Breast massage;Assist with latch;Adjust position  Audible Swallowing: A few with stimulation Intervention(s): Hand expression;Alternate breast massage  Type of Nipple: Everted at rest and after stimulation  Comfort (Breast/Nipple): Soft / non-tender     Hold (Positioning): Assistance needed to correctly position infant at breast and maintain latch. Intervention(s): Breastfeeding basics reviewed;Support Pillows;Position options;Skin to skin  LATCH Score: 8  Lactation Tools Discussed/Used     Consult Status Consult Status: Follow-up Date: 12/26/16 Follow-up type: In-patient    Ave Filter 12/25/2016, 1:40 PM

## 2016-12-25 NOTE — Progress Notes (Signed)
Post Partum Day 1 Subjective: no complaints, up ad lib, voiding, tolerating PO and + flatus  Objective: Blood pressure 113/79, pulse 71, temperature 98.4 F (36.9 C), temperature source Oral, resp. rate 20, height 5' 1"  (1.549 m), weight 131 lb (59.4 kg), SpO2 100 %, unknown if currently breastfeeding.  Physical Exam:  General: alert and cooperative Lochia: appropriate Uterine Fundus: firm Incision: healing well DVT Evaluation: No evidence of DVT seen on physical exam. Negative Homan's sign. No cords or calf tenderness. No significant calf/ankle edema.   Recent Labs  12/24/16 0416 12/25/16 0529  HGB 12.2 10.4*  HCT 35.6* 30.5*    Assessment/Plan: Plan for discharge tomorrow and Breastfeeding   LOS: 1 day   Alice Sherman G 12/25/2016, 8:21 AM

## 2016-12-25 NOTE — Plan of Care (Signed)
Problem: Tissue Perfusion: Goal: Risk factors for ineffective tissue perfusion will decrease Outcome: Completed/Met Date Met: 12/25/16 Encouraged increased ambulation. No mechanical VTE prophylaxis ordered.

## 2016-12-25 NOTE — Anesthesia Postprocedure Evaluation (Addendum)
Anesthesia Post Note  Patient: Alice Sherman  Procedure(s) Performed: * No procedures listed *  Anesthesia Type: Epidural        Last Vitals:  Vitals:   12/24/16 2330 12/25/16 0925  BP: 113/79 110/73  Pulse: 71 (!) 103  Resp: 20 19  Temp:  36.7 C    Last Pain:  Vitals:   12/25/16 0925  TempSrc: Oral  PainSc:    Pain Goal: Patients Stated Pain Goal: 1 (12/24/16 1920)               Casimer Lanius

## 2016-12-25 NOTE — Lactation Note (Signed)
This note was copied from a baby's chart. Lactation Consultation Note  Patient Name: Alice Sherman ICHTV'G Date: 12/25/2016 Reason for consult: Follow-up assessment Baby is now 72 hours old.  Mom states she has attempted to latch baby without success.  Baby was spitty earlier.  Baby just finished bath and is cueing to feed.  Mom has small breasts and areola with erect nipples.  Assisted with positioning baby in laid back position on left side.  Baby latches but unable to sustain the latch.  I had mom sit upright in bed and assisted with cross cradle on right.  Baby latch easily sustaining the latch and nursing actively.  Few swallows noted.  Mom comfortable with feeding.  Basic teaching done and questions answered.  Encouraged to call for assist/concerns prn.  Maternal Data    Feeding Feeding Type: Breast Fed Length of feed: 20 min  LATCH Score/Interventions Latch: Grasps breast easily, tongue down, lips flanged, rhythmical sucking. Intervention(s): Skin to skin;Teach feeding cues;Waking techniques Intervention(s): Breast compression;Breast massage;Assist with latch;Adjust position  Audible Swallowing: A few with stimulation Intervention(s): Skin to skin;Hand expression Intervention(s): Alternate breast massage  Type of Nipple: Everted at rest and after stimulation  Comfort (Breast/Nipple): Soft / non-tender     Hold (Positioning): Assistance needed to correctly position infant at breast and maintain latch. Intervention(s): Breastfeeding basics reviewed;Support Pillows;Position options;Skin to skin  LATCH Score: 8  Lactation Tools Discussed/Used     Consult Status Consult Status: Follow-up Date: 12/26/16 Follow-up type: In-patient    Ave Filter 12/25/2016, 10:21 AM

## 2016-12-26 ENCOUNTER — Ambulatory Visit: Payer: Self-pay

## 2016-12-26 NOTE — Lactation Note (Addendum)
This note was copied from a baby's chart. Lactation Consultation Note  Patient Name: Alice Sherman [id number] Date: 12/26/2016 Reason for consult: Follow-up assessment;Difficult latch  Baby 33 hours old. Mom reports that baby having difficulty latching to left breast. Enc parents to feed with cues and at least every 3 hours--waking if needed, since baby less than 5 pounds. Assisted mom with latching baby to left breast first in football position, and then in cross-cradle position. After several attempts baby latched deeply, maintaining a deep latch and nursing for 10 minutes with swallows noted. Enc mom to latch baby to opposite breast, which mom able to do easily, and baby nursed for additional 15 minutes in football position. Mom is easily expressible with colostrum flowing bilaterally. Enc mom to use hand pump to evert left nipple before latching, and to keep attempting to improve latch to left breast. Discussed using pump for left breast for additional stimulation until baby nursing well on left breast. Discussed milk coming to volume and engorgement prevention/treatment. Mom aware of OP/BFSG and [place] phone line assistance after D/C. Mom had questions about pumping to freeze milk, and all questions answered. Mom reports that she has a personal DEBP.  Maternal Data    Feeding Feeding Type: Breast Fed Length of feed: 10 min  LATCH Score/Interventions Latch: Repeated attempts needed to sustain latch, nipple held in mouth throughout feeding, stimulation needed to elicit sucking reflex. Intervention(s): Breast compression;Adjust position;Assist with latch  Audible Swallowing: A few with stimulation  Type of Nipple: Everted at rest and after stimulation  Comfort (Breast/Nipple): Soft / non-tender     Hold (Positioning): Assistance needed to correctly position infant at breast and maintain latch. Intervention(s): Breastfeeding basics reviewed;Support Pillows;Position options;Skin to skin  LATCH  Score: 7  Lactation Tools Discussed/Used     Consult Status Consult Status: PRN    Andres Labrum 12/26/2016, 9:50 AM

## 2016-12-26 NOTE — Lactation Note (Addendum)
This note was copied from a baby's chart. Lactation Consultation Note  Patient Name: Alice Sherman [id number] Date: 12/26/2016 Reason for consult: Follow-up assessment  Baby 74 hours old. Called back to room to assist with latching baby to left breast. Baby frustrated at breast but willing to latch and suckle a couple of times, off and on. Enc mom to only try on left breast for a few minutes, then nurse baby on right breast. Enc mom to then post-pump the left breast to protect milk supply. Discussed supplementing baby with EBM as well. Mom states that she now remembers this discussion. Patient's bedside nurse, Alice Filler, RN in the room also assisting with breastfeeding and aware of the assessment and interventions.  Maternal Data    Feeding Feeding Type: Breast Fed Length of feed: 0 min  LATCH Score/Interventions Latch: Too sleepy or reluctant, no latch achieved, no sucking elicited. Intervention(s): Assist with latch;Breast compression  Audible Swallowing: None Intervention(s): Hand expression  Type of Nipple: Everted at rest and after stimulation  Comfort (Breast/Nipple): Soft / non-tender     Hold (Positioning): Assistance needed to correctly position infant at breast and maintain latch.  LATCH Score: 5  Lactation Tools Discussed/Used     Consult Status Consult Status: Follow-up Date: 12/27/16 Follow-up type: In-patient    Alice Sherman 12/26/2016, 2:38 PM

## 2016-12-26 NOTE — Discharge Summary (Signed)
Obstetric Discharge Summary Reason for Admission: rupture of membranes Prenatal Procedures: ultrasound Intrapartum Procedures: spontaneous vaginal delivery Postpartum Procedures: none Complications-Operative and Postpartum: 2 degree perineal laceration Hemoglobin  Date Value Ref Range Status  12/25/2016 10.4 (L) 12.0 - 15.0 g/dL Final   HCT  Date Value Ref Range Status  12/25/2016 30.5 (L) 36.0 - 46.0 % Final    Physical Exam:  General: alert and cooperative Lochia: appropriate Uterine Fundus: firm Incision: healing well DVT Evaluation: No evidence of DVT seen on physical exam. Negative Homan's sign. No cords or calf tenderness. No significant calf/ankle edema.  Discharge Diagnoses: Term Pregnancy-delivered  Discharge Information: Date: 12/26/2016 Activity: pelvic rest Diet: routine Medications: PNV Condition: stable Instructions: refer to practice specific booklet Discharge to: home   Newborn Data: Live born female  Birth Weight: 5 lb 15.1 oz (2695 g) APGAR: 9, 10  Home with mother.  CURTIS,CAROL G 12/26/2016, 8:00 AM

## 2016-12-26 NOTE — Lactation Note (Signed)
This note was copied from a baby's chart. Lactation Consultation Note  Patient Name: Alice Sherman [id number] Date: 12/26/2016 Reason for consult: Follow-up assessment Baby at 22 hr of life. Mom can not get baby to latch to the L breast. Baby goes to the R breast with ease. Lactation was able to latch baby to the L breast but mom was unable to repeat. Mom is tearful and "ready to go home". She is worried if she can not get baby bf from both sides they will not be able to leave tomorrow. Applied #16 NS and mom was able to latch baby with minimal effort. Mom will bf on demand, post express, and spoon feed per volume guidelines. Dad and MGM at the bedside and supportive. She is aware of lactation services and support group.   Maternal Data    Feeding Feeding Type: Breast Fed Length of feed: 20 min  LATCH Score/Interventions Latch: Repeated attempts needed to sustain latch, nipple held in mouth throughout feeding, stimulation needed to elicit sucking reflex. Intervention(s): Adjust position;Breast compression  Audible Swallowing: Spontaneous and intermittent Intervention(s): Hand expression;Skin to skin Intervention(s): Alternate breast massage  Type of Nipple: Everted at rest and after stimulation  Comfort (Breast/Nipple): Soft / non-tender     Hold (Positioning): Assistance needed to correctly position infant at breast and maintain latch. Intervention(s): Position options;Support Pillows  LATCH Score: 8  Lactation Tools Discussed/Used Tools: Nipple Shields Nipple shield size: 16   Consult Status Consult Status: Follow-up Date: 12/27/16 Follow-up type: In-patient    Denzil Hughes 12/26/2016, 11:13 PM

## 2016-12-27 ENCOUNTER — Ambulatory Visit: Payer: Self-pay

## 2016-12-27 NOTE — Lactation Note (Signed)
This note was copied from a baby's chart. Lactation Consultation Note  Patient Name: Alice Sherman SAYTK'Z Date: 12/27/2016 Reason for consult: Follow-up assessment  LC has visited mom two more times today. And the baby was re- weighed x3 today and each time the weight increased. Pedis MD cleared baby for discharge.  Per mom the baby has been feeding about every 2 hours , and the breast are softening down .  LC updated the feedings she observed and per parents.  Milk is in , mom is npt engorged .  Per mom [facility] has a LC at their office. LC recommended to call their office and make an appt. Either Thursday or Friday. Pedis appt. For Friday at [facility] per mom.  Lactation Plan for D/C - written for mom , discussed with mom, dad, and grandmother( both great support for mom and baby ). They all mentioned they have a good understanding and mom had a few questions about storage - LC reviewed.   Breast feeding goals : Feed the baby very 2-3 hours and with feeding cues,  STS feedings until weight is increasing and baby can stay awake for feeding. Steps for latching - breast massage , hand express, if to full pre - pump with hand pump,  Latch with breast compressions right breast * without the NS .  For left breast - apply NS , and instill EBM in the top of the NS.  Latch and check lip lines. Check FISH lips (dad or grandma can check )  Engorgement prevention and tx: Breast are FULL - good sign - Breast feed or release breast down to comfort - hand pump or pump off 5-7 mins. ( protect milk supply )  Breast are > Full - tight - or heading towards firm - ICE 15 -20 mins- then steps above.  Extra pumping - after 4-5 feedings a day when baby isn't cluster feeding post pump 10 -15 mins or when baby only feeds 1st breast and softens well.    LC stressed to mom if it has bee 3 hours and the baby  Hasn't fed and won't latch, and due to weight loss , baby needs calories at least every 2-3  hours , to feed the baby pumped breast milk - start at 30 ml , and then try to latch , still no latch, may increase volume.   LC mentioned to mom if the [facility] office doesn't have an [place] to call [facility] services back for [facility] appt.      Maternal Data Has patient been taught Hand Expression?: Yes  Feeding Feeding Type: Breast Fed Length of feed: 20 min  LATCH Score/Interventions Latch: Grasps breast easily, tongue down, lips flanged, rhythmical sucking. Intervention(s): Skin to skin;Teach feeding cues;Waking techniques Intervention(s): Adjust position;Assist with latch;Breast massage;Breast compression  Audible Swallowing: Spontaneous and intermittent  Type of Nipple: Everted at rest and after stimulation  Comfort (Breast/Nipple): Filling, red/small blisters or bruises, mild/mod discomfort  Problem noted: Filling  Hold (Positioning): Assistance needed to correctly position infant at breast and maintain latch. Intervention(s): Breastfeeding basics reviewed;Support Pillows;Skin to skin;Position options  LATCH Score: 8  Lactation Tools Discussed/Used Tools: Nipple Shields (instilled in the top of the NS as an appertizer ) Nipple shield size: 20   Consult Status Consult Status: Follow-up Date: 12/27/16 Follow-up type: In-patient    [place] 12/27/2016, 2:56 PM

## 2016-12-27 NOTE — Lactation Note (Signed)
This note was copied from a baby's chart. Lactation Consultation Note  Patient Name: Alice Sherman [id number] Date: 12/27/2016 Reason for consult: Follow-up assessment;Infant weight loss (13% weight loss, ) LC entered the room and the baby was already latched on the left breast with #16 NS, LC noted an active swallowing pattern, increased with breast compressions.  When baby released noted the NS to be snug. LC resized NS and the #20 NS was better fit .  Baby latched for 5 mins with depth and released. Baby was able to pull nipple up into the NS adequately. Baby fussy, dad burped the baby and mom re- latched the baby on the right breast without the NS , and LC assisted mom to obtain depth, and mold the breast tissue in the baby's mouth, multiple swallows noted, increased with breast compressions and baby fed 15 mins.  Baby was re-weighed after feeding - increased to 5-3.6 oz, ( gain ).  Will re-visit mom for another latch and LC plan . Mom aware to call for LC .  Maternal Data    Feeding Feeding Type: Breast Fed (right breast / did not need a NS to latch ) Length of feed: 15 min  LATCH Score/Interventions Latch: Grasps breast easily, tongue down, lips flanged, rhythmical sucking.  Audible Swallowing: Spontaneous and intermittent (increased w/breast compressions )  Type of Nipple: Everted at rest and after stimulation  Comfort (Breast/Nipple): Soft / non-tender     Hold (Positioning): Assistance needed to correctly position infant at breast and maintain latch.  LATCH Score: 9  Lactation Tools Discussed/Used Tools: Nipple Shields Nipple shield size: 16   Consult Status Consult Status: Follow-up Date: 12/27/16 Follow-up type: In-patient    [place] 12/27/2016, 10:33 AM

## 2017-01-29 ENCOUNTER — Ambulatory Visit
Admission: RE | Admit: 2017-01-29 | Discharge: 2017-01-29 | Disposition: A | Discharge status: Home / Self Care | Payer: BLUE CROSS/BLUE SHIELD | Source: Ambulatory Visit | Attending: Obstetrics and Gynecology | Admitting: Obstetrics and Gynecology

## 2017-01-29 ENCOUNTER — Other Ambulatory Visit: Payer: Self-pay | Admitting: Obstetrics and Gynecology

## 2017-01-29 DIAGNOSIS — N6001 Solitary cyst of right breast: Secondary | ICD-10-CM

## 2017-01-29 HISTORY — DX: Unspecified lump in unspecified breast: N63.0

## 2017-01-30 ENCOUNTER — Other Ambulatory Visit: Payer: BLUE CROSS/BLUE SHIELD

## 2017-02-01 ENCOUNTER — Ambulatory Visit
Admission: RE | Admit: 2017-02-01 | Discharge: 2017-02-01 | Disposition: A | Discharge status: Home / Self Care | Payer: BLUE CROSS/BLUE SHIELD | Source: Ambulatory Visit | Attending: Obstetrics and Gynecology | Admitting: Obstetrics and Gynecology

## 2017-02-01 ENCOUNTER — Other Ambulatory Visit: Payer: Self-pay | Admitting: Obstetrics and Gynecology

## 2017-02-01 DIAGNOSIS — N611 Abscess of the breast and nipple: Secondary | ICD-10-CM

## 2017-02-01 DIAGNOSIS — N6001 Solitary cyst of right breast: Secondary | ICD-10-CM

## 2017-02-03 LAB — AEROBIC/ANAEROBIC CULTURE W GRAM STAIN (SURGICAL/DEEP WOUND)

## 2017-02-05 ENCOUNTER — Other Ambulatory Visit: Payer: Self-pay | Admitting: Obstetrics and Gynecology

## 2017-02-05 ENCOUNTER — Ambulatory Visit
Admission: RE | Admit: 2017-02-05 | Discharge: 2017-02-05 | Disposition: A | Discharge status: Home / Self Care | Payer: BLUE CROSS/BLUE SHIELD | Source: Ambulatory Visit | Attending: Obstetrics and Gynecology | Admitting: Obstetrics and Gynecology

## 2017-02-05 DIAGNOSIS — N6001 Solitary cyst of right breast: Secondary | ICD-10-CM

## 2017-02-12 ENCOUNTER — Other Ambulatory Visit: Payer: Self-pay | Admitting: Obstetrics and Gynecology

## 2017-02-12 ENCOUNTER — Ambulatory Visit
Admission: RE | Admit: 2017-02-12 | Discharge: 2017-02-12 | Disposition: A | Discharge status: Home / Self Care | Payer: BLUE CROSS/BLUE SHIELD | Source: Ambulatory Visit | Attending: Obstetrics and Gynecology | Admitting: Obstetrics and Gynecology

## 2017-02-12 DIAGNOSIS — N6001 Solitary cyst of right breast: Secondary | ICD-10-CM

## 2017-02-16 ENCOUNTER — Ambulatory Visit
Admission: RE | Admit: 2017-02-16 | Discharge: 2017-02-16 | Disposition: A | Discharge status: Home / Self Care | Payer: BLUE CROSS/BLUE SHIELD | Source: Ambulatory Visit | Attending: Obstetrics and Gynecology | Admitting: Obstetrics and Gynecology

## 2017-02-16 ENCOUNTER — Other Ambulatory Visit: Payer: Self-pay | Admitting: Obstetrics and Gynecology

## 2017-02-16 DIAGNOSIS — N6001 Solitary cyst of right breast: Secondary | ICD-10-CM

## 2017-02-23 ENCOUNTER — Ambulatory Visit
Admission: RE | Admit: 2017-02-23 | Discharge: 2017-02-23 | Disposition: A | Discharge status: Home / Self Care | Payer: BLUE CROSS/BLUE SHIELD | Source: Ambulatory Visit | Attending: Obstetrics and Gynecology | Admitting: Obstetrics and Gynecology

## 2017-02-23 DIAGNOSIS — N6001 Solitary cyst of right breast: Secondary | ICD-10-CM

## 2017-04-10 ENCOUNTER — Encounter: Payer: Self-pay | Admitting: Emergency Medicine

## 2017-04-10 ENCOUNTER — Emergency Department
Admission: EM | Admit: 2017-04-10 | Discharge: 2017-04-10 | Disposition: A | Discharge status: Home / Self Care | Payer: BLUE CROSS/BLUE SHIELD | Attending: Emergency Medicine | Admitting: Emergency Medicine

## 2017-04-10 DIAGNOSIS — Z2914 Encounter for prophylactic rabies immune globin: Secondary | ICD-10-CM | POA: Diagnosis present

## 2017-04-10 DIAGNOSIS — Z23 Encounter for immunization: Secondary | ICD-10-CM | POA: Diagnosis not present

## 2017-04-10 MED ORDER — RABIES VACCINE, PCEC IM SUSR
INTRAMUSCULAR | Status: AC
  Filled 2017-04-10: qty 1

## 2017-04-10 MED ORDER — RABIES VACCINE, PCEC IM SUSR
1.0000 mL | Freq: Once | INTRAMUSCULAR | Status: AC
  Administered 2017-04-10: 1 mL via INTRAMUSCULAR
  Filled 2017-04-10: qty 1

## 2017-04-10 MED ORDER — RABIES IMMUNE GLOBULIN 150 UNIT/ML IM INJ
20.0000 [IU]/kg | INJECTION | Freq: Once | INTRAMUSCULAR | Status: AC
  Administered 2017-04-10: 1050 [IU] via INTRAMUSCULAR
  Filled 2017-04-10: qty 8

## 2017-04-10 NOTE — ED Provider Notes (Signed)
[facility] Emergency Department Provider Note  ____________________________________________  Time seen: Approximately 5:06 PM  I have reviewed the triage vital signs and the nursing notes.   HISTORY  Chief Complaint Rabies Injection    HPI Alice Sherman is a 31 y.o. female who presents emergency Department with her husband requesting rabies vaccine status post possible encounter with a bat. Patient reports that she woke up with a bat flying around the room.patient is unsure of any direct contact/bite. Patient denies any symptoms at this time. She has spoken to her primary care who recommended rabies vaccine. All questions are answered at this time. Patient to proceed with rabies vaccine after lengthy discussion about risks versus benefits.patient's tetanus shot is up-to-date. No other complaints or concerns at this time.   Past Medical History:  Diagnosis Date  . Anemia    with pregnancy  . Breast mass   . Irritable bowel syndrome (IBS)    symptoms mostly diarrhea  . Medical history non-contributory     Patient Active Problem List   Diagnosis Date Noted  . Indication for care in labor or delivery 12/24/2016  . Renal colic on right side 62/26/3335  . Kidney stone 10/24/2016  . Nephrolithiasis 10/24/2016    Past Surgical History:  Procedure Laterality Date  . CYSTOSCOPY WITH URETEROSCOPY AND STENT PLACEMENT Right 10/28/2016   Procedure: CYSTOSCOPY WITH RIGHT URETEROSCOPY;  Surgeon: Irine Seal, MD;  Location: [facility] ORS;  Service: Urology;  Laterality: Right;  . NO PAST SURGERIES      Prior to Admission medications   Medication Sig Start Date End Date Taking? Authorizing Provider  acetaminophen (TYLENOL) 500 MG tablet Take 1,000 mg by mouth every 6 (six) hours as needed for mild pain, moderate pain or headache.    [provider]  Prenatal Vit-Fe Fumarate-FA (PRENATAL MULTIVITAMIN) TABS tablet Take 1 tablet by mouth at bedtime.    [provider]    Allergies Ceclor [cefaclor]; Codeine; Ibuprofen; Penicillins; and Sulfa antibiotics  Family History  Problem Relation Age of Onset  . Hypertension Mother     Social History Social History  Substance Use Topics  . Smoking status: Never Smoker  . Smokeless tobacco: Never Used  . Alcohol use No     Review of Systems  Constitutional: No fever/chills Eyes: No visual changes. No discharge ENT: No upper respiratory complaints. Cardiovascular: no chest pain. Respiratory: no cough. No SOB. Gastrointestinal: No abdominal pain.  No nausea, no vomiting.  No diarrhea.  No constipation. Musculoskeletal: Negative for musculoskeletal pain. Skin: Negative for rash, abrasions, lacerations, ecchymosis. Neurological: Negative for headaches, focal weakness or numbness. 10-point ROS otherwise negative.  ____________________________________________   PHYSICAL EXAM:  VITAL SIGNS: ED Triage Vitals  Enc Vitals Group     BP 04/10/17 1634 112/85     Pulse Rate 04/10/17 1634 87     Resp 04/10/17 1634 18     Temp 04/10/17 1634 98 F (36.7 C)     Temp Source 04/10/17 1634 Oral     SpO2 04/10/17 1634 100 %     Weight 04/10/17 1631 117 lb (53.1 kg)     Height 04/10/17 1631 5' 1"  (1.549 m)     Head Circumference --      Peak Flow --      Pain Score --      Pain Loc --      Pain Edu? --      Excl. in [place]? --      Constitutional:  Alert and oriented. Well appearing and in no acute distress. Eyes: Conjunctivae are normal. PERRL. EOMI. Head: Atraumatic. ENT:      Ears:       Nose: No congestion/rhinnorhea.      Mouth/Throat: Mucous membranes are moist.  Neck: No stridor.   Hematological/Lymphatic/Immunilogical: No cervical lymphadenopathy. Cardiovascular: Normal rate, regular rhythm. Normal S1 and S2.  Good peripheral circulation. Respiratory: Normal respiratory effort without tachypnea or retractions. Lungs CTAB. Good air entry to the bases with no decreased or absent  breath sounds. Musculoskeletal: Full range of motion to all extremities. No gross deformities appreciated. Neurologic:  Normal speech and language. No gross focal neurologic deficits are appreciated.  Skin:  Skin is warm, dry and intact. No rash noted. Psychiatric: Mood and affect are normal. Speech and behavior are normal. Patient exhibits appropriate insight and judgement.   ____________________________________________   LABS (all labs ordered are listed, but only abnormal results are displayed)  Labs Reviewed - No data to display ____________________________________________  EKG   ____________________________________________  RADIOLOGY   No results found.  ____________________________________________    PROCEDURES  Procedure(s) performed:    Procedures    Medications  rabies immune globulin (HYPERAB) injection 1,050 Units (1,050 Units Intramuscular Given 04/10/17 1748)  rabies vaccine (RABAVERT) injection 1 mL (1 mL Intramuscular Given 04/10/17 1746)     ____________________________________________   INITIAL IMPRESSION / ASSESSMENT AND PLAN / ED COURSE  Pertinent labs & imaging results that were available during my care of the patient were reviewed by me and considered in my medical decision making (see chart for details).  Review of the [place] CSRS was performed in accordance of the [place] prior to dispensing any controlled drugs.     Patient's diagnosis is consistent with he is for rabies vaccine after possible bat exposure.patient elects to proceed with rabies vaccine after lengthy discussion of risks versus benefits after possible bat exposure. No symptoms at this time. No other complaints. Patient's tetanus shot was up-to-date. Patient is advised to follow-up on days 3, 7, 14 for further rabies vaccine.  Patient is given ED precautions to return to the ED for any worsening or new symptoms.     ____________________________________________  FINAL CLINICAL  IMPRESSION(S) / ED DIAGNOSES  Final diagnoses:  Need for rabies vaccination      NEW MEDICATIONS STARTED DURING THIS VISIT:  New Prescriptions   No medications on file        This chart was dictated using voice recognition software/Dragon. Despite best efforts to proofread, errors can occur which can change the meaning. Any change was purely unintentional.    Darletta Moll, PA-C 04/10/17 Ether Griffins, MD 04/10/17 [id number]

## 2017-04-10 NOTE — ED Triage Notes (Signed)
Presents for possible rabies shots  States she was exposed to a bat on Monday am

## 2017-04-13 ENCOUNTER — Encounter: Payer: Self-pay | Admitting: Emergency Medicine

## 2017-04-13 ENCOUNTER — Emergency Department
Admission: EM | Admit: 2017-04-13 | Discharge: 2017-04-13 | Disposition: A | Discharge status: Home / Self Care | Payer: BLUE CROSS/BLUE SHIELD | Attending: Emergency Medicine | Admitting: Emergency Medicine

## 2017-04-13 DIAGNOSIS — Z203 Contact with and (suspected) exposure to rabies: Secondary | ICD-10-CM | POA: Insufficient documentation

## 2017-04-13 DIAGNOSIS — Z2914 Encounter for prophylactic rabies immune globin: Secondary | ICD-10-CM | POA: Diagnosis present

## 2017-04-13 DIAGNOSIS — Z79899 Other long term (current) drug therapy: Secondary | ICD-10-CM | POA: Diagnosis not present

## 2017-04-13 MED ORDER — RABIES VACCINE, PCEC IM SUSR
1.0000 mL | Freq: Once | INTRAMUSCULAR | Status: AC
  Administered 2017-04-13: 1 mL via INTRAMUSCULAR
  Filled 2017-04-13: qty 1

## 2017-04-13 NOTE — ED Provider Notes (Signed)
[facility] Emergency Department Provider Note   ____________________________________________   First MD Initiated Contact with Patient 04/13/17 [phone]     (approximate)  I have reviewed the triage vital signs and the nursing notes.   HISTORY  Chief Complaint Rabies Injection    HPI Alice Sherman is a 31 y.o. female patient here today for second rabies series secondary to back exposure and their home. Patient state no problems taking initial injections.   Past Medical History:  Diagnosis Date  . Anemia    with pregnancy  . Breast mass   . Irritable bowel syndrome (IBS)    symptoms mostly diarrhea  . Medical history non-contributory     Patient Active Problem List   Diagnosis Date Noted  . Indication for care in labor or delivery 12/24/2016  . Renal colic on right side 49/75/3005  . Kidney stone 10/24/2016  . Nephrolithiasis 10/24/2016    Past Surgical History:  Procedure Laterality Date  . CYSTOSCOPY WITH URETEROSCOPY AND STENT PLACEMENT Right 10/28/2016   Procedure: CYSTOSCOPY WITH RIGHT URETEROSCOPY;  Surgeon: Irine Seal, MD;  Location: [facility];  Service: Urology;  Laterality: Right;  . NO PAST SURGERIES      Prior to Admission medications   Medication Sig Start Date End Date Taking? Authorizing Provider  acetaminophen (TYLENOL) 500 MG tablet Take 1,000 mg by mouth every 6 (six) hours as needed for mild pain, moderate pain or headache.    [provider]  Prenatal Vit-Fe Fumarate-FA (PRENATAL MULTIVITAMIN) TABS tablet Take 1 tablet by mouth at bedtime.    [provider]    Allergies Ceclor [cefaclor]; Codeine; Ibuprofen; Penicillins; and Sulfa antibiotics  Family History  Problem Relation Age of Onset  . Hypertension Mother     Social History Social History  Substance Use Topics  . Smoking status: Never Smoker  . Smokeless tobacco: Never Used  . Alcohol use No    Review of Systems Constitutional: No  fever/chills Eyes: No visual changes. ENT: No sore throat. Cardiovascular: Denies chest pain. Respiratory: Denies shortness of breath. Gastrointestinal: No abdominal pain.  No nausea, no vomiting.  No diarrhea.  No constipation. Genitourinary: Negative for dysuria. Musculoskeletal: Negative for back pain. Skin: Negative for rash. Neurological: Negative for headaches, focal weakness or numbness. Allergic/Immunilogical: See medication list ____________________________________________   PHYSICAL EXAM:  VITAL SIGNS: ED Triage Vitals  Enc Vitals Group     BP 04/13/17 0807 102/79     Pulse Rate 04/13/17 0807 87     Resp 04/13/17 0807 16     Temp 04/13/17 0807 98.4 F (36.9 C)     Temp Source 04/13/17 0807 Oral     SpO2 04/13/17 0807 99 %     Weight 04/13/17 0804 117 lb (53.1 kg)     Height 04/13/17 0804 5' 1"  (1.549 m)     Head Circumference --      Peak Flow --      Pain Score 04/13/17 0804 0     Pain Loc --      Pain Edu? --      Excl. in [place]? --     Constitutional: Alert and oriented. Well appearing and in no acute distress. Nose: No congestion/rhinnorhea. Mouth/Throat: Mucous membranes are moist.  Oropharynx non-erythematous. Neck: No stridor.  No cervical spine tenderness to palpation. Hematological/Lymphatic/Immunilogical: No cervical lymphadenopathy. Cardiovascular: Normal rate, regular rhythm. Grossly normal heart sounds.  Good peripheral circulation. Respiratory: Normal respiratory effort.  No retractions. Lungs CTAB. Neurologic:  Normal speech and language. No gross focal neurologic deficits are appreciated. No gait instability. Skin:  Skin is warm, dry and intact. No rash noted. Psychiatric: Mood and affect are normal. Speech and behavior are normal.  ____________________________________________   LABS (all labs ordered are listed, but only abnormal results are displayed)  Labs Reviewed - No data to  display ____________________________________________  EKG   ____________________________________________  RADIOLOGY   ____________________________________________   PROCEDURES  Procedure(s) performed: None  Procedures  Critical Care performed: No  ____________________________________________   INITIAL IMPRESSION / ASSESSMENT AND PLAN / ED COURSE  Pertinent labs & imaging results that were available during my care of the patient were reviewed by me and considered in my medical decision making (see chart for details).  Second rabies series injection forbat exposure. Patient given discharge care instructions.  Advised to follow-up to complete series per CDC policy.    ____________________________________________   FINAL CLINICAL IMPRESSION(S) / ED DIAGNOSES  Final diagnoses:  Rabies exposure      NEW MEDICATIONS STARTED DURING THIS VISIT:  New Prescriptions   No medications on file     Note:  This document was prepared using Dragon voice recognition software and may include unintentional dictation errors.    Sable Feil, PA-C 04/13/17 [id number]    Lisa Roca, MD 04/13/17 [phone]

## 2017-04-13 NOTE — ED Triage Notes (Signed)
Here for 2nd Rabies injection

## 2017-04-13 NOTE — ED Notes (Signed)
Found bat in house, here for 2nd injection

## 2017-04-17 ENCOUNTER — Encounter: Payer: Self-pay | Admitting: Emergency Medicine

## 2017-04-17 ENCOUNTER — Emergency Department
Admission: EM | Admit: 2017-04-17 | Discharge: 2017-04-17 | Disposition: A | Discharge status: Home / Self Care | Payer: BLUE CROSS/BLUE SHIELD | Attending: Emergency Medicine | Admitting: Emergency Medicine

## 2017-04-17 DIAGNOSIS — Z23 Encounter for immunization: Secondary | ICD-10-CM | POA: Insufficient documentation

## 2017-04-17 MED ORDER — RABIES VACCINE, PCEC IM SUSR
1.0000 mL | Freq: Once | INTRAMUSCULAR | Status: AC
  Administered 2017-04-17: 1 mL via INTRAMUSCULAR
  Filled 2017-04-17: qty 1

## 2017-04-17 NOTE — ED Triage Notes (Signed)
Here for 3rd injection

## 2017-04-17 NOTE — ED Notes (Signed)
NAD noted at time of D/C. Pt denies questions or concerns. Pt ambulatory to the lobby at this time.  

## 2017-04-17 NOTE — ED Provider Notes (Signed)
[facility] Emergency Department Provider Note ____________________________________________  Time seen: 9:17 AM  I have reviewed the triage vital signs and the nursing notes.  HISTORY  Chief Complaint  Rabies Injection   HPI Alice Sherman is a 31 y.o. female is here for third rabies injection. Patient denies any problems with the first 2 in the series of prophylaxis after being exposed to bats.    Past Medical History:  Diagnosis Date  . Anemia    with pregnancy  . Breast mass   . Irritable bowel syndrome (IBS)    symptoms mostly diarrhea  . Medical history non-contributory     Patient Active Problem List   Diagnosis Date Noted  . Indication for care in labor or delivery 12/24/2016  . Renal colic on right side 42/87/6811  . Kidney stone 10/24/2016  . Nephrolithiasis 10/24/2016    Past Surgical History:  Procedure Laterality Date  . CYSTOSCOPY WITH URETEROSCOPY AND STENT PLACEMENT Right 10/28/2016   Procedure: CYSTOSCOPY WITH RIGHT URETEROSCOPY;  Surgeon: Irine Seal, MD;  Location: [facility];  Service: Urology;  Laterality: Right;  . NO PAST SURGERIES      Prior to Admission medications   Medication Sig Start Date End Date Taking? Authorizing Provider  acetaminophen (TYLENOL) 500 MG tablet Take 1,000 mg by mouth every 6 (six) hours as needed for mild pain, moderate pain or headache.    [provider]  Prenatal Vit-Fe Fumarate-FA (PRENATAL MULTIVITAMIN) TABS tablet Take 1 tablet by mouth at bedtime.    [provider]    Allergies Ceclor [cefaclor]; Codeine; Ibuprofen; Penicillins; and Sulfa antibiotics  Family History  Problem Relation Age of Onset  . Hypertension Mother     Social History Social History  Substance Use Topics  . Smoking status: Never Smoker  . Smokeless tobacco: Never Used  . Alcohol use No    Review of Systems  Constitutional: Negative for fever. Cardiovascular: Negative for chest pain. Respiratory:  Negative for shortness of breath. Skin: Negative for rash. Neurological: Negative for headaches ____________________________________________  PHYSICAL EXAM:  VITAL SIGNS: ED Triage Vitals  Enc Vitals Group     BP 04/17/17 0804 103/71     Pulse Rate 04/17/17 0804 78     Resp 04/17/17 0804 20     Temp 04/17/17 0804 97.8 F (36.6 C)     Temp Source 04/17/17 0804 Oral     SpO2 04/17/17 0804 100 %     Weight --      Height --      Head Circumference --      Peak Flow --      Pain Score 04/17/17 1015 0     Pain Loc --      Pain Edu? --      Excl. in [place]? --     Constitutional: Alert and oriented. Well appearing and in no distress. Head: Normocephalic and atraumatic. Neck: No stridor Cardiovascular: Normal rate, regular rhythm. Normal distal pulses. Respiratory: Normal respiratory effort. No wheezes/rales/rhonchi. Musculoskeletal: Nontender with normal range of motion in all extremities.  Neurologic:  Normal gait. Normal speech and language. No gross focal neurologic deficits are appreciated. Skin:  Skin is warm, dry and intact. No rash noted. Psychiatric: Mood and affect are normal. Patient exhibits appropriate insight and judgment. ____________________________________________  INITIAL IMPRESSION / ASSESSMENT AND PLAN / ED COURSE  Patient will return for Scheduled rabies immunization. There is no adverse reaction prior to discharge.    ____________________________________________  FINAL CLINICAL IMPRESSION(S) /  ED DIAGNOSES  Final diagnoses:  Need for prophylactic vaccination against rabies     Johnn Hai, PA-C 04/17/17 1107    Darel Hong, MD 04/17/17 1254

## 2017-04-17 NOTE — Discharge Instructions (Signed)
Return for her remaining rabies vaccinations.

## 2017-04-24 ENCOUNTER — Encounter: Payer: Self-pay | Admitting: Emergency Medicine

## 2017-04-24 ENCOUNTER — Emergency Department
Admission: EM | Admit: 2017-04-24 | Discharge: 2017-04-24 | Disposition: A | Discharge status: Home / Self Care | Payer: BLUE CROSS/BLUE SHIELD | Attending: Emergency Medicine | Admitting: Emergency Medicine

## 2017-04-24 DIAGNOSIS — Z79899 Other long term (current) drug therapy: Secondary | ICD-10-CM | POA: Diagnosis not present

## 2017-04-24 DIAGNOSIS — Z23 Encounter for immunization: Secondary | ICD-10-CM

## 2017-04-24 DIAGNOSIS — Z203 Contact with and (suspected) exposure to rabies: Secondary | ICD-10-CM | POA: Diagnosis present

## 2017-04-24 MED ORDER — RABIES VACCINE, PCEC IM SUSR
1.0000 mL | Freq: Once | INTRAMUSCULAR | Status: AC
  Administered 2017-04-24: 1 mL via INTRAMUSCULAR
  Filled 2017-04-24: qty 1

## 2017-04-24 NOTE — ED Provider Notes (Signed)
[facility] Emergency Department Provider Note  ____________________________________________  Time seen: Approximately 8:30 AM  I have reviewed the triage vital signs and the nursing notes.   HISTORY  Chief Complaint Rabies Injection   HPI Alice Sherman is a 31 y.o. female who presents to the emergency department for her last rabies injection. She's had no adverse reactions to the first 3. He has no new complaints today.  Past Medical History:  Diagnosis Date  . Anemia    with pregnancy  . Breast mass   . Irritable bowel syndrome (IBS)    symptoms mostly diarrhea  . Medical history non-contributory     Patient Active Problem List   Diagnosis Date Noted  . Indication for care in labor or delivery 12/24/2016  . Renal colic on right side 94/17/4081  . Kidney stone 10/24/2016  . Nephrolithiasis 10/24/2016    Past Surgical History:  Procedure Laterality Date  . CYSTOSCOPY WITH URETEROSCOPY AND STENT PLACEMENT Right 10/28/2016   Procedure: CYSTOSCOPY WITH RIGHT URETEROSCOPY;  Surgeon: Irine Seal, MD;  Location: [facility];  Service: Urology;  Laterality: Right;  . NO PAST SURGERIES      Prior to Admission medications   Medication Sig Start Date End Date Taking? Authorizing Provider  acetaminophen (TYLENOL) 500 MG tablet Take 1,000 mg by mouth every 6 (six) hours as needed for mild pain, moderate pain or headache.    [provider]  Prenatal Vit-Fe Fumarate-FA (PRENATAL MULTIVITAMIN) TABS tablet Take 1 tablet by mouth at bedtime.    [provider]    Allergies Ceclor [cefaclor]; Codeine; Ibuprofen; Penicillins; and Sulfa antibiotics  Family History  Problem Relation Age of Onset  . Hypertension Mother     Social History Social History  Substance Use Topics  . Smoking status: Never Smoker  . Smokeless tobacco: Never Used  . Alcohol use No    Review of Systems Constitutional: Negative for fever. ENT: Negative for sore  throat. Respiratory: Negative for cough or shortness of breath. Gastrointestinal: No abdominal pain.  No nausea, no vomiting.  No diarrhea.  Musculoskeletal: Negative for generalized body aches. Skin: Negative for rash/lesion/wound. Neurological: Negative for headaches, focal weakness or numbness.  ____________________________________________   PHYSICAL EXAM:  VITAL SIGNS: ED Triage Vitals  Enc Vitals Group     BP 04/24/17 0757 102/69     Pulse Rate 04/24/17 0757 83     Resp 04/24/17 0757 16     Temp 04/24/17 0757 98.4 F (36.9 C)     Temp Source 04/24/17 0757 Oral     SpO2 04/24/17 0757 99 %     Weight 04/24/17 0756 117 lb (53.1 kg)     Height 04/24/17 0756 5' 1"  (1.549 m)     Head Circumference --      Peak Flow --      Pain Score --      Pain Loc --      Pain Edu? --      Excl. in [place]? --     Constitutional: Alert and oriented. Well appearing and in no acute distress. Eyes: Conjunctivae are normal. PERRL. EOMI. Head: Atraumatic. Nose: No congestion/rhinnorhea. Mouth/Throat: Mucous membranes are moist. Neck: No stridor.  Cardiovascular: Normal rate, regular rhythm. Good peripheral circulation. Respiratory: Normal respiratory effort. Musculoskeletal: Full ROM throughout.  Neurologic:  Normal speech and language. No gross focal neurologic deficits are appreciated. Speech is normal. No gait instability. Skin:  Skin is warm, dry and intact. No rash noted. Psychiatric: Mood  and affect are normal. Speech and behavior are normal.  ____________________________________________   LABS (all labs ordered are listed, but only abnormal results are displayed)  Labs Reviewed - No data to display ____________________________________________  EKG  Not indicated ____________________________________________  RADIOLOGY  Not indicated ____________________________________________   PROCEDURES  None ____________________________________________   INITIAL IMPRESSION /  ASSESSMENT AND PLAN / ED COURSE  31 year old female presenting to the emergency department for 4 of 4 rabies prophylaxis. She was advised follow-up with primary care provider for symptoms of concern or return to the emergency department if unable schedule an appointment.  Pertinent labs & imaging results that were available during my care of the patient were reviewed by me and considered in my medical decision making (see chart for details).  ____________________________________________   FINAL CLINICAL IMPRESSION(S) / ED DIAGNOSES  Final diagnoses:  Rabies, need for prophylactic vaccination against       Victorino Dike, FNP 04/24/17 1357    Earleen Newport, MD 04/24/17 1430

## 2017-04-24 NOTE — ED Notes (Signed)
See triage note  Here for rabies vaccine

## 2017-04-24 NOTE — ED Triage Notes (Signed)
Here for 4th Rabies injection

## 2017-04-27 NOTE — Addendum Note (Signed)
Addendum  created 04/27/17 0945 by Effie Berkshire, MD   Sign clinical note

## 2017-10-26 ENCOUNTER — Other Ambulatory Visit
Admission: RE | Admit: 2017-10-26 | Discharge: 2017-10-26 | Disposition: A | Discharge status: Home / Self Care | Payer: Managed Care, Other (non HMO) | Source: Ambulatory Visit | Attending: Student | Admitting: Student

## 2017-10-26 DIAGNOSIS — R197 Diarrhea, unspecified: Secondary | ICD-10-CM | POA: Diagnosis present

## 2017-10-26 LAB — GASTROINTESTINAL PANEL BY PCR, STOOL (REPLACES STOOL CULTURE)

## 2017-10-26 LAB — C DIFFICILE QUICK SCREEN W PCR REFLEX
C Diff antigen: NEGATIVE
C Diff interpretation: NOT DETECTED
C Diff toxin: NEGATIVE

## 2017-10-30 ENCOUNTER — Other Ambulatory Visit: Payer: Self-pay

## 2017-10-30 ENCOUNTER — Inpatient Hospital Stay: Payer: Managed Care, Other (non HMO) | Attending: Internal Medicine | Admitting: Internal Medicine

## 2017-10-30 ENCOUNTER — Inpatient Hospital Stay: Payer: Managed Care, Other (non HMO)

## 2017-10-30 ENCOUNTER — Encounter: Payer: Self-pay | Admitting: Internal Medicine

## 2017-10-30 DIAGNOSIS — K589 Irritable bowel syndrome without diarrhea: Secondary | ICD-10-CM | POA: Diagnosis not present

## 2017-10-30 DIAGNOSIS — Z8619 Personal history of other infectious and parasitic diseases: Secondary | ICD-10-CM | POA: Insufficient documentation

## 2017-10-30 DIAGNOSIS — D72828 Other elevated white blood cell count: Secondary | ICD-10-CM | POA: Diagnosis not present

## 2017-10-30 DIAGNOSIS — Z79899 Other long term (current) drug therapy: Secondary | ICD-10-CM | POA: Insufficient documentation

## 2017-10-30 DIAGNOSIS — R197 Diarrhea, unspecified: Secondary | ICD-10-CM | POA: Diagnosis not present

## 2017-10-30 DIAGNOSIS — Z87442 Personal history of urinary calculi: Secondary | ICD-10-CM | POA: Diagnosis not present

## 2017-10-30 DIAGNOSIS — Z806 Family history of leukemia: Secondary | ICD-10-CM | POA: Insufficient documentation

## 2017-10-30 LAB — CBC WITH DIFFERENTIAL/PLATELET
Basophils Absolute: 0.2 10*3/uL — ABNORMAL HIGH (ref 0–0.1)
Basophils Relative: 1 %
Eosinophils Absolute: 2.1 10*3/uL — ABNORMAL HIGH (ref 0–0.7)
Eosinophils Relative: 14 %
HCT: 36 % (ref 35.0–47.0)
Hemoglobin: 12.2 g/dL (ref 12.0–16.0)
Lymphocytes Relative: 11 %
Lymphs Abs: 1.8 10*3/uL (ref 1.0–3.6)
MCH: 29.1 pg (ref 26.0–34.0)
MCHC: 33.7 g/dL (ref 32.0–36.0)
MCV: 86.3 fL (ref 80.0–100.0)
Monocytes Absolute: 1 10*3/uL — ABNORMAL HIGH (ref 0.2–0.9)
Monocytes Relative: 7 %
Neutro Abs: 10.6 10*3/uL — ABNORMAL HIGH (ref 1.4–6.5)
Neutrophils Relative %: 67 %
Platelets: 460 10*3/uL — ABNORMAL HIGH (ref 150–440)
RBC: 4.18 MIL/uL (ref 3.80–5.20)
RDW: 13.5 % (ref 11.5–14.5)
WBC: 15.7 10*3/uL — ABNORMAL HIGH (ref 3.6–11.0)

## 2017-10-30 LAB — COMPREHENSIVE METABOLIC PANEL WITH GFR
ALT: 8 U/L — ABNORMAL LOW (ref 14–54)
AST: 10 U/L — ABNORMAL LOW (ref 15–41)
Albumin: 3.5 g/dL (ref 3.5–5.0)
Alkaline Phosphatase: 89 U/L (ref 38–126)
Anion gap: 8 (ref 5–15)
BUN: 6 mg/dL (ref 6–20)
CO2: 23 mmol/L (ref 22–32)
Calcium: 8.4 mg/dL — ABNORMAL LOW (ref 8.9–10.3)
Chloride: 104 mmol/L (ref 101–111)
Creatinine, Ser: 0.71 mg/dL (ref 0.44–1.00)
GFR calc Af Amer: >60 mL/min
GFR calc non Af Amer: >60 mL/min
Glucose, Bld: 103 mg/dL — ABNORMAL HIGH (ref 65–99)
Potassium: 3.6 mmol/L (ref 3.5–5.1)
Sodium: 135 mmol/L (ref 135–145)
Total Bilirubin: 0.4 mg/dL (ref 0.3–1.2)
Total Protein: 7.8 g/dL (ref 6.5–8.1)

## 2017-10-30 LAB — LACTATE DEHYDROGENASE: LDH: 89 U/L — ABNORMAL LOW (ref 98–192)

## 2017-10-30 NOTE — Progress Notes (Signed)
Here for new pt evaluation. Referred by Dr Vira Agar . Since early Nov having 6-8 watery diarrhea stools w cramping. Stated she has noted bright bloodin stool also -hx of hemmorhoids she stated.

## 2017-10-30 NOTE — Assessment & Plan Note (Addendum)
#  Question etiology reactive versus malignancy like HES given the eosinophilia. White count of 17; neutrophil count 10; elevation of eosinophils [2.0]; normal lymphocyte count; hemoglobin 11.9; platelets 489.  However "smudge cells" noted.  CRP elevated-35.   #See the workup below.  I discussed the possible need for bone marrow biopsy-based upon results of the lower workup.  #Diarrhea 1 month-stool infectious workup negative so far.  Defer to GI for further workup.  #I recommend further evaluation with CBC; LDH; CMP; peripheral blood flow cytometry for leukemia lymphoma panel; review of peripheral smear. Discussed with pathology.   [phone]/ cell phone [will pt with results]. follow up to be TBD.  Thank you Dr. Tiffany Kocher for allowing me to participate in the care of your pleasant patient. Please do not hesitate to contact me with questions or concerns in the interim.  Addendum: Discussed with Dr. Elliott-regarding colonoscopy;  Need for a CT scan of abdomen pelvis.  Discussed with the patient my recommendations; would await above workup-to decide on bone marrow biopsy.  Patient agreement.  Cc; Dr.Elliot.

## 2017-10-30 NOTE — Progress Notes (Signed)
[facility] NOTE  Patient Care Team: [place], 72 [facility] as PCP - General  CHIEF COMPLAINTS/PURPOSE OF CONSULTATION:  Abnormal blood counts  #   No history exists.     HISTORY OF PRESENTING ILLNESS:  Alice Sherman 31 y.o.  female referred to [place] for further evaluation recommendations for "smudge cells" noted on peripheral smear.  Patient states that she has been having diarrhea for the last 1 month anywhere 6-10 loose stools a day.  No significant cramping.  She has been taking 2 Imodium in the morning and 1 at night.  No obvious blood.  Denies any night sweats.  No significant weight loss.  She has low-grade fevers.  Denies any skin rash.  Denies any lumps or bumps.  Patient has a history of mastitis/breast abscess-drained.  Currently resolved.  Patient also had a prior history of kidney stone that was extracted on the right side.  ROS: A complete 10 point review of system is done which is negative except mentioned above in history of present illness  MEDICAL HISTORY:  Past Medical History:  Diagnosis Date  . Anemia    with pregnancy  . Breast mass   . Irritable bowel syndrome (IBS)    symptoms mostly diarrhea  . Kidney stones 10/2016   r kidney stone  . Medical history non-contributory     SURGICAL HISTORY: Past Surgical History:  Procedure Laterality Date  . CYSTOSCOPY WITH URETEROSCOPY AND STENT PLACEMENT Right 10/28/2016   Procedure: CYSTOSCOPY WITH RIGHT URETEROSCOPY;  Surgeon: Irine Seal, MD;  Location: [facility];  Service: Urology;  Laterality: Right;  . 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 Social History   Socioeconomic History  . Marital status: Married    Spouse name: Not on file  . Number of children: Not on file  . Years of education: Not on file  . Highest education level: Not on file  Social Needs  . Financial resource strain: Not on file  .  Food insecurity - worry: Not on file  . Food insecurity - inability: Not on file  . Transportation needs - medical: Not on file  . Transportation needs - non-medical: Not on file  Occupational History  . Not on file  Tobacco Use  . Smoking status: Never Smoker  . Smokeless tobacco: Never Used  Substance and Sexual Activity  . Alcohol use: Yes    Comment: socially   . Drug use: No  . Sexual activity: Yes  Other Topics Concern  . Not on file  Social History Narrative  . Not on file    FAMILY HISTORY: paternal grandpda- "leukemia" Family History  Problem Relation Age of Onset  . Hypertension Mother   . Kidney Stones Father   . Diverticulitis Father   . Leukemia Paternal Grandfather     ALLERGIES:  is allergic to cefaclor; sulfasalazine; codeine; ibuprofen; penicillins; and sulfa antibiotics.  MEDICATIONS:  Current Outpatient Medications  Medication Sig Dispense Refill  . ciprofloxacin (CIPRO) 500 MG tablet Take 500 mg by mouth 2 (two) times daily.    . Norethin Ace-Eth Estrad-FE (BLISOVI FE 1/20 PO) Take by mouth.    Marland Kitchen acetaminophen (TYLENOL) 500 MG tablet Take 1,000 mg by mouth every 6 (six) hours as needed for mild pain, moderate pain or headache.    . Prenatal Vit-Fe Fumarate-FA (PRENATAL MULTIVITAMIN) TABS tablet Take 1 tablet by mouth at bedtime.  No current facility-administered medications for this visit.       Marland Kitchen  PHYSICAL EXAMINATION: ECOG PERFORMANCE STATUS: 1 - Symptomatic but completely ambulatory  Vitals:   10/30/17 0833  BP: 123/80  Pulse: (!) 118  Resp: 18  Temp: 99.2 F (37.3 C)   Filed Weights   10/30/17 0833  Weight: 111 lb 15.9 oz (50.8 kg)    GENERAL: Well-nourished well-developed; Alert, no distress and comfortable.   Accompanied by family. EYES: no pallor or icterus OROPHARYNX: no thrush or ulceration; good dentition  NECK: supple, no masses felt LYMPH:  no palpable lymphadenopathy in the cervical, axillary or inguinal  regions LUNGS: clear to auscultation and  No wheeze or crackles HEART/CVS: regular rate & rhythm and no murmurs; No lower extremity edema ABDOMEN: abdomen soft, non-tender and normal bowel sounds Musculoskeletal:no cyanosis of digits and no clubbing  PSYCH: alert & oriented x 3 with fluent speech NEURO: no focal motor/sensory deficits SKIN:  no rashes or significant lesions  LABORATORY DATA:  I have reviewed the data as listed Lab Results  Component Value Date   WBC 15.7 (H) 10/30/2017   HGB 12.2 10/30/2017   HCT 36.0 10/30/2017   MCV 86.3 10/30/2017   PLT 460 (H) 10/30/2017   Recent Labs    10/30/17 0925  NA 135  K 3.6  CL 104  CO2 23  GLUCOSE 103*  BUN 6  CREATININE 0.71  CALCIUM 8.4*  GFRNONAA >60  GFRAA >60  PROT 7.8  ALBUMIN 3.5  AST 10*  ALT 8*  ALKPHOS 89  BILITOT 0.4    RADIOGRAPHIC STUDIES: I have personally reviewed the radiological images as listed and agreed with the findings in the report. No results found.  ASSESSMENT & PLAN:   Acquired neutrophilia #Question etiology reactive versus malignancy like HES given the eosinophilia. White count of 17; neutrophil count 10; elevation of eosinophils [2.0]; normal lymphocyte count; hemoglobin 11.9; platelets 489.  However "smudge cells" noted.  CRP elevated-35.   #See the workup below.  I discussed the possible need for bone marrow biopsy-based upon results of the lower workup.  #Diarrhea 1 month-stool infectious workup negative so far.  Defer to GI for further workup.  #I recommend further evaluation with CBC; LDH; CMP; peripheral blood flow cytometry for leukemia lymphoma panel; review of peripheral smear. Discussed with pathology.   [phone]/ cell phone [will pt with results]. follow up to be TBD.  Thank you Dr. Tiffany Kocher for allowing me to participate in the care of your pleasant patient. Please do not hesitate to contact me with questions or concerns in the interim.  Addendum: Discussed with Dr.  Elliott-regarding colonoscopy;  Need for a CT scan of abdomen pelvis.  Discussed with the patient my recommendations; would await above workup-to decide on bone marrow biopsy.  Patient agreement.  Cc; Dr.Elliot.   All questions were answered. The patient knows to call the clinic with any problems, questions or concerns.    Cammie Sickle, MD 10/30/2017 5:16 PM

## 2017-10-31 LAB — PATHOLOGIST SMEAR REVIEW

## 2017-11-01 ENCOUNTER — Other Ambulatory Visit: Payer: Self-pay | Admitting: Unknown Physician Specialty

## 2017-11-01 DIAGNOSIS — K51919 Ulcerative colitis, unspecified with unspecified complications: Secondary | ICD-10-CM

## 2017-11-01 LAB — COMP PANEL: LEUKEMIA/LYMPHOMA

## 2017-11-01 LAB — CALPROTECTIN, FECAL: Calprotectin, Fecal: 525 ug/g — ABNORMAL HIGH (ref 0–120)

## 2017-11-02 ENCOUNTER — Ambulatory Visit
Admission: RE | Admit: 2017-11-02 | Discharge: 2017-11-02 | Disposition: A | Discharge status: Home / Self Care | Payer: Managed Care, Other (non HMO) | Source: Ambulatory Visit | Attending: Unknown Physician Specialty | Admitting: Unknown Physician Specialty

## 2017-11-02 DIAGNOSIS — K51919 Ulcerative colitis, unspecified with unspecified complications: Secondary | ICD-10-CM | POA: Diagnosis not present

## 2017-11-02 MED ORDER — IOPAMIDOL (ISOVUE-300) INJECTION 61%
75.0000 mL | Freq: Once | INTRAVENOUS | Status: AC | PRN
  Administered 2017-11-02: 75 mL via INTRAVENOUS

## 2017-11-06 ENCOUNTER — Telehealth: Payer: Self-pay | Admitting: Internal Medicine

## 2017-11-06 LAB — BCR-ABL1 FISH
Cells Analyzed: 200
Cells Counted: 200
PDF: 0

## 2017-11-06 NOTE — Telephone Encounter (Signed)
H/B-  Please inform patient that all her blood work from hematology-standpoint has come back negative for any cancers/leukemia.  Continue follow-up with Dr. Vira Agar.

## 2017-11-08 NOTE — Telephone Encounter (Signed)
I left message for patient to return phone call.

## 2017-11-08 NOTE — Telephone Encounter (Signed)
Patient notified of these lab results and verbalized understanding.

## 2019-05-02 ENCOUNTER — Other Ambulatory Visit: Payer: Self-pay | Admitting: Urology

## 2019-05-02 DIAGNOSIS — Z87442 Personal history of urinary calculi: Secondary | ICD-10-CM

## 2019-05-05 ENCOUNTER — Ambulatory Visit
Admission: RE | Admit: 2019-05-05 | Discharge: 2019-05-05 | Disposition: A | Discharge status: Home / Self Care | Payer: Managed Care, Other (non HMO) | Source: Ambulatory Visit | Attending: Urology | Admitting: Urology

## 2019-05-05 ENCOUNTER — Ambulatory Visit
Admission: RE | Admit: 2019-05-05 | Discharge: 2019-05-05 | Disposition: A | Discharge status: Home / Self Care | Payer: Managed Care, Other (non HMO) | Attending: Urology | Admitting: Urology

## 2019-05-05 ENCOUNTER — Other Ambulatory Visit: Payer: Self-pay

## 2019-05-05 DIAGNOSIS — Z87442 Personal history of urinary calculi: Secondary | ICD-10-CM

## 2019-07-28 LAB — OB RESULTS CONSOLE GC/CHLAMYDIA
Chlamydia: NEGATIVE
Gonorrhea: NEGATIVE

## 2019-07-28 LAB — OB RESULTS CONSOLE HEPATITIS B SURFACE ANTIGEN: Hepatitis B Surface Ag: NEGATIVE

## 2019-07-28 LAB — OB RESULTS CONSOLE RPR: RPR: NONREACTIVE

## 2019-07-28 LAB — OB RESULTS CONSOLE RUBELLA ANTIBODY, IGM: Rubella: IMMUNE

## 2019-07-28 LAB — OB RESULTS CONSOLE ANTIBODY SCREEN: Antibody Screen: NEGATIVE

## 2019-07-28 LAB — OB RESULTS CONSOLE ABO/RH: RH Type: POSITIVE

## 2019-07-28 LAB — OB RESULTS CONSOLE HIV ANTIBODY (ROUTINE TESTING): HIV: NONREACTIVE

## 2020-01-25 ENCOUNTER — Encounter (HOSPITAL_COMMUNITY): Payer: Self-pay | Admitting: Obstetrics and Gynecology

## 2020-01-25 ENCOUNTER — Inpatient Hospital Stay (EMERGENCY_DEPARTMENT_HOSPITAL)
Admission: AD | Admit: 2020-01-25 | Discharge: 2020-01-25 | Disposition: A | Discharge status: Home / Self Care | Payer: PRIVATE HEALTH INSURANCE | Source: Home / Self Care | Attending: Obstetrics and Gynecology | Admitting: Obstetrics and Gynecology

## 2020-01-25 ENCOUNTER — Other Ambulatory Visit: Payer: Self-pay

## 2020-01-25 DIAGNOSIS — Z3A34 34 weeks gestation of pregnancy: Secondary | ICD-10-CM | POA: Insufficient documentation

## 2020-01-25 DIAGNOSIS — Z87442 Personal history of urinary calculi: Secondary | ICD-10-CM | POA: Insufficient documentation

## 2020-01-25 DIAGNOSIS — Z3689 Encounter for other specified antenatal screening: Secondary | ICD-10-CM | POA: Diagnosis not present

## 2020-01-25 DIAGNOSIS — R109 Unspecified abdominal pain: Secondary | ICD-10-CM | POA: Diagnosis not present

## 2020-01-25 DIAGNOSIS — M549 Dorsalgia, unspecified: Secondary | ICD-10-CM

## 2020-01-25 DIAGNOSIS — O26893 Other specified pregnancy related conditions, third trimester: Secondary | ICD-10-CM | POA: Insufficient documentation

## 2020-01-25 DIAGNOSIS — O26833 Pregnancy related renal disease, third trimester: Secondary | ICD-10-CM | POA: Diagnosis not present

## 2020-01-25 DIAGNOSIS — O99891 Other specified diseases and conditions complicating pregnancy: Secondary | ICD-10-CM

## 2020-01-25 HISTORY — DX: Ulcerative colitis, unspecified, without complications: K51.90

## 2020-01-25 LAB — URINALYSIS, ROUTINE W REFLEX MICROSCOPIC
Bilirubin Urine: NEGATIVE
Glucose, UA: NEGATIVE mg/dL
Hgb urine dipstick: NEGATIVE
Ketones, ur: NEGATIVE mg/dL
Leukocytes,Ua: NEGATIVE
Nitrite: NEGATIVE
Protein, ur: NEGATIVE mg/dL
Specific Gravity, Urine: 1.002 — ABNORMAL LOW (ref 1.005–1.030)
pH: 7 (ref 5.0–8.0)

## 2020-01-25 NOTE — Discharge Instructions (Signed)
If your back pain returns, take tylenol (per bottle directions) and/or apply heat to affected area - if your symptoms worsen or don't improve please return to MAU. Also return to MAU if your symptoms are associated with nausea/vomiting or fever/chills.        Flank Pain, Adult Flank pain is pain that is located on the side of the body between the upper abdomen and the back. This area is called the flank. The pain may occur over a short period of time (acute), or it may be long-term or recurring (chronic). It may be mild or severe. Flank pain can be caused by many things, including:  Muscle soreness or injury.  Kidney stones or kidney disease.  Stress.  A disease of the spine (vertebral disk disease).  A lung infection (pneumonia).  Fluid around the lungs (pulmonary edema).  A skin rash caused by the chickenpox virus (shingles).  Tumors that affect the back of the abdomen.  Gallbladder disease. Follow these instructions at home:   Drink enough fluid to keep your urine clear or pale yellow.  Rest as told by your health care provider.  Take over-the-counter and prescription medicines only as told by your health care provider.  Keep a journal to track what has caused your flank pain and what has made it feel better.  Keep all follow-up visits as told by your health care provider. This is important. Contact a health care provider if:  Your pain is not controlled with medicine.  You have new symptoms.  Your pain gets worse.  You have a fever.  Your symptoms last longer than 2-3 days.  You have trouble urinating or you are urinating very frequently. Get help right away if:  You have trouble breathing or you are short of breath.  Your abdomen hurts or it is swollen or red.  You have nausea or vomiting.  You feel faint or you pass out.  You have blood in your urine. Summary  Flank pain is pain that is located on the side of the body between the upper abdomen  and the back.  The pain may occur over a short period of time (acute), or it may be long-term or recurring (chronic). It may be mild or severe.  Flank pain can be caused by many things.  Contact your health care provider if your symptoms get worse or they last longer than 2-3 days. This information is not intended to replace advice given to you by your health care provider. Make sure you discuss any questions you have with your health care provider. Document Revised: 10/26/2017 Document Reviewed: 01/26/2017 Elsevier Patient Education  2020 [employer].      Fetal Movement Counts Patient Name: ________________________________________________ Patient Due Date: ____________________ What is a fetal movement count?  A fetal movement count is the number of times that you feel your baby move during a certain amount of time. This may also be called a fetal kick count. A fetal movement count is recommended for every pregnant woman. You may be asked to start counting fetal movements as early as week 28 of your pregnancy. Pay attention to when your baby is most active. You may notice your baby's sleep and wake cycles. You may also notice things that make your baby move more. You should do a fetal movement count:  When your baby is normally most active.  At the same time each day. A good time to count movements is while you are resting, after having something to  eat and drink. How do I count fetal movements? 1. Find a quiet, comfortable area. Sit, or lie down on your side. 2. Write down the date, the start time and stop time, and the number of movements that you felt between those two times. Take this information with you to your health care visits. 3. Write down your start time when you feel the first movement. 4. Count kicks, flutters, swishes, rolls, and jabs. You should feel at least 10 movements. 5. You may stop counting after you have felt 10 movements, or if you have been counting for 2  hours. Write down the stop time. 6. If you do not feel 10 movements in 2 hours, contact your health care provider for further instructions. Your health care provider may want to do additional tests to assess your baby's well-being. Contact a health care provider if:  You feel fewer than 10 movements in 2 hours.  Your baby is not moving like he or she usually does. Date: ____________ Start time: ____________ Stop time: ____________ Movements: ____________ Date: ____________ Start time: ____________ Stop time: ____________ Movements: ____________ Date: ____________ Start time: ____________ Stop time: ____________ Movements: ____________ Date: ____________ Start time: ____________ Stop time: ____________ Movements: ____________ Date: ____________ Start time: ____________ Stop time: ____________ Movements: ____________ Date: ____________ Start time: ____________ Stop time: ____________ Movements: ____________ Date: ____________ Start time: ____________ Stop time: ____________ Movements: ____________ Date: ____________ Start time: ____________ Stop time: ____________ Movements: ____________ Date: ____________ Start time: ____________ Stop time: ____________ Movements: ____________ This information is not intended to replace advice given to you by your health care provider. Make sure you discuss any questions you have with your health care provider. Document Revised: 07/03/2019 Document Reviewed: 07/03/2019 Elsevier Patient Education  [employer].

## 2020-01-25 NOTE — MAU Note (Signed)
Patient reports UTI symptoms that started yesterday morning (pressure, urgency, pain).  Reports history of kidney stones w/ her first pregnancy. Denies any bleeding.  Denies CTX/LOF.  Endorses + FM.

## 2020-01-25 NOTE — MAU Provider Note (Signed)
Chief Complaint:  Back Pain   First Provider Initiated Contact with Patient 01/25/20 2130     HPI: Alice Sherman is a 34 y.o. G2P1001 at 36w4dwho presents to maternity admissions reporting back pain. History of kidney stones during her last pregnancy & was concerned due to her symptoms today. Reports episode of left flank pain earlier this evening, last felt pain around 630 pm. Is unsure if back pain felt like previous kidney stone. Also reports occasional pelvic pressure and is unsure if it's related to voiding or is a possible urinary tract infection. Denies n/v, fever/chills, hematuria, or dysuria.  Denies contractions, leakage of fluid or vaginal bleeding. Good fetal movement. Currently denies any pain.  [place] in [place] goes to [facility] for her ob/gyn care.   Past Medical History:  Diagnosis Date  . Anemia    with pregnancy  . Breast mass   . Irritable bowel syndrome (IBS)    symptoms mostly diarrhea  . Kidney stones 10/2016   r kidney stone  . Ulcerative colitis ([facility]    OB History  Gravida Para Term Preterm AB Living  2 1 1     1   SAB TAB Ectopic Multiple Live Births        0 1    # Outcome Date GA Lbr Len/2nd Weight Sex Delivery Anes PTL Lv  2 Current           1 Term 12/24/16 [id number]:12 / 03:03 2695 g F Vag-Spont EPI  LIV     Birth Comments: WDL   Past Surgical History:  Procedure Laterality Date  . CYSTOSCOPY WITH URETEROSCOPY AND STENT PLACEMENT Right 10/28/2016   Procedure: CYSTOSCOPY WITH RIGHT URETEROSCOPY;  Surgeon: JoIrine SealMD;  Location: [facility];  Service: Urology;  Laterality: Right;   Family History  Problem Relation Age of Onset  . Hypertension Mother   . Kidney Stones Father   . Diverticulitis Father   . Leukemia Paternal Grandfather    Social History   Tobacco Use  . Smoking status: Never Smoker  . Smokeless tobacco: Never Used  Substance Use Topics  . Alcohol use: Yes    Comment: socially   . Drug use: No    Allergies  Allergen Reactions  . Cefaclor Hives, Swelling and Other (See Comments)    Reaction:  Lip swelling  Reaction:  Lip swelling  Reaction:Lip swelling   Reaction:Lip swelling    . Sulfasalazine Hives  . Codeine Hives, Nausea And Vomiting, Swelling and Other (See Comments)    Reaction:  Lip swelling  . Ibuprofen Hives, Swelling and Other (See Comments)    Reaction:  Swelling of lips and hands   . Penicillins Hives, Swelling and Other (See Comments)    Reaction:  Swelling of lips and hands  Has patient had a PCN reaction causing immediate rash, facial/tongue/throat swelling, SOB or lightheadedness with hypotension: Yes Has patient had a PCN reaction causing severe rash involving mucus membranes or skin necrosis: No Has patient had a PCN reaction that required hospitalization No Has patient had a PCN reaction occurring within the last 10 years: No If all of the above answers are "NO", then may proceed with Cephalosporin use.  . Sulfa Antibiotics Hives   No medications prior to admission.    I have reviewed patient's Past Medical Hx, Surgical Hx, Family Hx, Social Hx, medications and allergies.   ROS:  Review of Systems  Constitutional: Negative.   Gastrointestinal: Negative.  Genitourinary: Negative.   Musculoskeletal: Positive for back pain (none currently).    Physical Exam   Patient Vitals for the past 24 hrs:  BP Temp Pulse Resp Weight  01/25/20 2159 119/68 - 91 17 -  01/25/20 2051 102/65 98.8 F (37.1 C) 96 17 67.7 kg    Constitutional: Well-developed, well-nourished female in no acute distress.  Cardiovascular: normal rate & rhythm, no murmur Respiratory: normal effort, lung sounds clear throughout GI: No CVAT. Abd soft, non-tender, gravid appropriate for gestational age. Pos BS x 4 MS: Extremities nontender, no edema, normal ROM Neurologic: Alert and oriented x 4.   NST:  Baseline: 145 bpm, Variability: Good {> 6 bpm), Accelerations: Reactive  and Decelerations: Absent   Labs: Results for orders placed or performed during the hospital encounter of 01/25/20 (from the past 24 hour(s))  Urinalysis, Routine w reflex microscopic     Status: Abnormal   Collection Time: 01/25/20  9:10 PM  Result Value Ref Range   Color, Urine COLORLESS (A) YELLOW   APPearance CLEAR CLEAR   Specific Gravity, Urine 1.002 (L) 1.005 - 1.030   pH 7.0 5.0 - 8.0   Glucose, UA NEGATIVE NEGATIVE mg/dL   Hgb urine dipstick NEGATIVE NEGATIVE   Bilirubin Urine NEGATIVE NEGATIVE   Ketones, ur NEGATIVE NEGATIVE mg/dL   Protein, ur NEGATIVE NEGATIVE mg/dL   Nitrite NEGATIVE NEGATIVE   Leukocytes,Ua NEGATIVE NEGATIVE    Imaging:  No results found.  MAU Course: Orders Placed This Encounter  Procedures  . Urinalysis, Routine w reflex microscopic  . Discharge patient   No orders of the defined types were placed in this encounter.   MDM: Reactive NST. Some irregular contractions. Pt denies any abdominal pain or perception of contractions.   Urinalysis negative. Pt afebrile. No CVA tenderness.  Patient given option to return for worsening symptoms vs renal imaging tonight vs follow up with her urologist. Patient comfortable with being discharged home without further evaluation. Instructed to return to MAU for return of pain that is not treatable with tylenol, n/v, or fever/chills.   Assessment: 1. Back pain affecting pregnancy in third trimester   2. [redacted] weeks gestation of pregnancy     Plan: Discharge home in stable condition.  Preterm Labor precautions and fetal kick counts Follow-up Information    [place] Follow up.   Why: Keep scheduled appointment or call office as needed Contact information: [address] [place] [address] [phone]           Allergies as of 01/25/2020      Reactions   Cefaclor Hives, Swelling, Other (See Comments)   Reaction:  Lip swelling  Reaction:  Lip swelling   Reaction:Lip swelling  Reaction:Lip swelling    Sulfasalazine Hives   Codeine Hives, Nausea And Vomiting, Swelling, Other (See Comments)   Reaction:  Lip swelling   Ibuprofen Hives, Swelling, Other (See Comments)   Reaction:  Swelling of lips and hands    Penicillins Hives, Swelling, Other (See Comments)   Reaction:  Swelling of lips and hands  Has patient had a PCN reaction causing immediate rash, facial/tongue/throat swelling, SOB or lightheadedness with hypotension: Yes Has patient had a PCN reaction causing severe rash involving mucus membranes or skin necrosis: No Has patient had a PCN reaction that required hospitalization No Has patient had a PCN reaction occurring within the last 10 years: No If all of the above answers are "NO", then may proceed with Cephalosporin use.  Sulfa Antibiotics Hives      Medication List    STOP taking these medications   BLISOVI FE 1/20 PO   ciprofloxacin 500 MG tablet Commonly known as: CIPRO     TAKE these medications   acetaminophen 500 MG tablet Commonly known as: TYLENOL Take 1,000 mg by mouth every 6 (six) hours as needed for mild pain, moderate pain or headache.   prenatal multivitamin Tabs tablet Take 1 tablet by mouth at bedtime.       Jorje Guild, NP 01/25/2020 10:27 PM

## 2020-01-26 ENCOUNTER — Encounter (HOSPITAL_COMMUNITY): Payer: Self-pay | Admitting: Obstetrics and Gynecology

## 2020-01-26 ENCOUNTER — Inpatient Hospital Stay (EMERGENCY_DEPARTMENT_HOSPITAL)
Admission: AD | Admit: 2020-01-26 | Discharge: 2020-01-26 | Disposition: A | Discharge status: Home / Self Care | Payer: PRIVATE HEALTH INSURANCE | Source: Home / Self Care | Attending: Obstetrics and Gynecology | Admitting: Obstetrics and Gynecology

## 2020-01-26 ENCOUNTER — Other Ambulatory Visit: Payer: Self-pay

## 2020-01-26 ENCOUNTER — Inpatient Hospital Stay (HOSPITAL_COMMUNITY): Payer: PRIVATE HEALTH INSURANCE

## 2020-01-26 DIAGNOSIS — R109 Unspecified abdominal pain: Secondary | ICD-10-CM | POA: Insufficient documentation

## 2020-01-26 DIAGNOSIS — Z881 Allergy status to other antibiotic agents status: Secondary | ICD-10-CM | POA: Insufficient documentation

## 2020-01-26 DIAGNOSIS — Z3689 Encounter for other specified antenatal screening: Secondary | ICD-10-CM

## 2020-01-26 DIAGNOSIS — Z880 Allergy status to penicillin: Secondary | ICD-10-CM | POA: Insufficient documentation

## 2020-01-26 DIAGNOSIS — Z3A34 34 weeks gestation of pregnancy: Secondary | ICD-10-CM | POA: Insufficient documentation

## 2020-01-26 DIAGNOSIS — Z87442 Personal history of urinary calculi: Secondary | ICD-10-CM | POA: Insufficient documentation

## 2020-01-26 DIAGNOSIS — O99891 Other specified diseases and conditions complicating pregnancy: Secondary | ICD-10-CM | POA: Insufficient documentation

## 2020-01-26 LAB — URINALYSIS, ROUTINE W REFLEX MICROSCOPIC
Bilirubin Urine: NEGATIVE
Glucose, UA: NEGATIVE mg/dL
Hgb urine dipstick: NEGATIVE
Ketones, ur: NEGATIVE mg/dL
Leukocytes,Ua: NEGATIVE
Nitrite: NEGATIVE
Protein, ur: NEGATIVE mg/dL
Specific Gravity, Urine: 1.006 (ref 1.005–1.030)
pH: 7 (ref 5.0–8.0)

## 2020-01-26 LAB — COMPREHENSIVE METABOLIC PANEL
ALT: 13 U/L (ref 0–44)
AST: 19 U/L (ref 15–41)
Albumin: 3 g/dL — ABNORMAL LOW (ref 3.5–5.0)
Alkaline Phosphatase: 128 U/L — ABNORMAL HIGH (ref 38–126)
Anion gap: 9 (ref 5–15)
BUN: 7 mg/dL (ref 6–20)
CO2: 22 mmol/L (ref 22–32)
Calcium: 9.1 mg/dL (ref 8.9–10.3)
Chloride: 107 mmol/L (ref 98–111)
Creatinine, Ser: 0.49 mg/dL (ref 0.44–1.00)
GFR calc Af Amer: >60 mL/min (ref >60)
GFR calc non Af Amer: >60 mL/min (ref >60)
Glucose, Bld: 91 mg/dL (ref 70–99)
Potassium: 3.9 mmol/L (ref 3.5–5.1)
Sodium: 138 mmol/L (ref 135–145)
Total Bilirubin: 0.4 mg/dL (ref 0.3–1.2)
Total Protein: 6.5 g/dL (ref 6.5–8.1)

## 2020-01-26 LAB — CBC WITH DIFFERENTIAL/PLATELET
Abs Immature Granulocytes: 0.05 10*3/uL (ref 0.00–0.07)
Basophils Absolute: 0 10*3/uL (ref 0.0–0.1)
Basophils Relative: 0 %
Eosinophils Absolute: 0 10*3/uL (ref 0.0–0.5)
Eosinophils Relative: 0 %
HCT: 33.6 % — ABNORMAL LOW (ref 36.0–46.0)
Hemoglobin: 11.1 g/dL — ABNORMAL LOW (ref 12.0–15.0)
Immature Granulocytes: 1 %
Lymphocytes Relative: 21 %
Lymphs Abs: 2.1 10*3/uL (ref 0.7–4.0)
MCH: 32.1 pg (ref 26.0–34.0)
MCHC: 33 g/dL (ref 30.0–36.0)
MCV: 97.1 fL (ref 80.0–100.0)
Monocytes Absolute: 0.6 10*3/uL (ref 0.1–1.0)
Monocytes Relative: 6 %
Neutro Abs: 7.5 10*3/uL (ref 1.7–7.7)
Neutrophils Relative %: 72 %
Platelets: 219 10*3/uL (ref 150–400)
RBC: 3.46 MIL/uL — ABNORMAL LOW (ref 3.87–5.11)
RDW: 14.8 % (ref 11.5–15.5)
WBC: 10.4 10*3/uL (ref 4.0–10.5)
nRBC: 0 % (ref 0.0–0.2)

## 2020-01-26 MED ORDER — TAMSULOSIN HCL 0.4 MG PO CAPS: 0.4000 mg | ORAL_CAPSULE | Freq: Every day | ORAL | 1 refills | Status: DC

## 2020-01-26 MED ORDER — HYDROMORPHONE HCL 2 MG PO TABS: 2.0000 mg | ORAL_TABLET | ORAL | 0 refills | Status: DC | PRN

## 2020-01-26 MED ORDER — NITROFURANTOIN MONOHYD MACRO 100 MG PO CAPS: 100.0000 mg | ORAL_CAPSULE | Freq: Two times a day (BID) | ORAL | 0 refills | Status: DC

## 2020-01-26 MED ORDER — PROMETHAZINE HCL 25 MG/ML IJ SOLN
12.5000 mg | Freq: Once | INTRAMUSCULAR | Status: AC
  Administered 2020-01-26: 12.5 mg via INTRAVENOUS
  Filled 2020-01-26: qty 1

## 2020-01-26 MED ORDER — HYDROMORPHONE HCL 1 MG/ML IJ SOLN
1.0000 mg | Freq: Once | INTRAMUSCULAR | Status: AC
  Administered 2020-01-26: 1 mg via INTRAMUSCULAR

## 2020-01-26 MED ORDER — HYDROMORPHONE HCL 1 MG/ML IJ SOLN
1.0000 mg | Freq: Once | INTRAMUSCULAR | Status: DC
  Filled 2020-01-26: qty 1

## 2020-01-26 MED ORDER — HYDROMORPHONE HCL 1 MG/ML IJ SOLN
1.0000 mg | Freq: Once | INTRAMUSCULAR | Status: AC
  Administered 2020-01-26: 1 mg via INTRAVENOUS
  Filled 2020-01-26: qty 1

## 2020-01-26 MED ORDER — LACTATED RINGERS IV BOLUS
1000.0000 mL | Freq: Once | INTRAVENOUS | Status: AC
  Administered 2020-01-26: 1000 mL via INTRAVENOUS

## 2020-01-26 NOTE — Discharge Instructions (Signed)
Flank Pain, Adult Flank pain is pain that is located on the side of the body between the upper abdomen and the back. This area is called the flank. The pain may occur over a short period of time (acute), or it may be long-term or recurring (chronic). It may be mild or severe. Flank pain can be caused by many things, including:  Muscle soreness or injury.  Kidney stones or kidney disease.  Stress.  A disease of the spine (vertebral disk disease).  A lung infection (pneumonia).  Fluid around the lungs (pulmonary edema).  A skin rash caused by the chickenpox virus (shingles).  Tumors that affect the back of the abdomen.  Gallbladder disease. Follow these instructions at home:   Drink enough fluid to keep your urine clear or pale yellow.  Rest as told by your health care provider.  Take over-the-counter and prescription medicines only as told by your health care provider.  Keep a journal to track what has caused your flank pain and what has made it feel better.  Keep all follow-up visits as told by your health care provider. This is important. Contact a health care provider if:  Your pain is not controlled with medicine.  You have new symptoms.  Your pain gets worse.  You have a fever.  Your symptoms last longer than 2-3 days.  You have trouble urinating or you are urinating very frequently. Get help right away if:  You have trouble breathing or you are short of breath.  Your abdomen hurts or it is swollen or red.  You have nausea or vomiting.  You feel faint or you pass out.  You have blood in your urine. Summary  Flank pain is pain that is located on the side of the body between the upper abdomen and the back.  The pain may occur over a short period of time (acute), or it may be long-term or recurring (chronic). It may be mild or severe.  Flank pain can be caused by many things.  Contact your health care provider if your symptoms get worse or they last  longer than 2-3 days. This information is not intended to replace advice given to you by your health care provider. Make sure you discuss any questions you have with your health care provider. Document Revised: 10/26/2017 Document Reviewed: 01/26/2017 Elsevier Patient Education  [place].

## 2020-01-26 NOTE — MAU Provider Note (Signed)
History     CSN: [id number]  Arrival date and time: 01/26/20 1455   First Provider Initiated Contact with Patient 01/26/20 1618      Chief Complaint  Patient presents with  . Flank Pain   Alice Sherman is a 34 y.o. G2P1001 at 61w5dwho receives care at [facility].  She presents today for Flank Pain.  She states the pain started yesterday, but she was able to sleep through the night. She states around 130pm this afternoon the pain restarted and has been "pretty continuous."  Patient reports the pain "is left kidney side."  She describes it as a constant stabbing pain that she rates 5-6/10, but she has had bouts of 10/10 today.  She denies issues with urination or fever/chills. Patient reports kidney stones with previous pregnancy that required cystoscopy for relief.  Patient endorses fetal movement and denies vaginal concerns.  She reports some abdominal cramping, but feels this may be related to the pain.   Patient actively passing around the room during taking off narrative.  Patient reports that walking improves pain to some extent as well as "standing and breathing through it, b/c sitting still does not help."     OB History    Gravida  2   Para  1   Term  1   Preterm      AB      Living  1     SAB      TAB      Ectopic      Multiple  0   Live Births  1           Past Medical History:  Diagnosis Date  . Anemia    with pregnancy  . Breast mass   . Irritable bowel syndrome (IBS)    symptoms mostly diarrhea  . Kidney stones 10/2016   r kidney stone  . Ulcerative colitis ([facility]     Past Surgical History:  Procedure Laterality Date  . CYSTOSCOPY WITH URETEROSCOPY AND STENT PLACEMENT Right 10/28/2016   Procedure: CYSTOSCOPY WITH RIGHT URETEROSCOPY;  Surgeon: JIrine Seal MD;  Location: [facility];  Service: Urology;  Laterality: Right;    Family History  Problem Relation Age of Onset  . Hypertension Mother   . Kidney Stones Father   . Diverticulitis  Father   . Leukemia Paternal Grandfather     Social History   Tobacco Use  . Smoking status: Never Smoker  . Smokeless tobacco: Never Used  Substance Use Topics  . Alcohol use: Yes    Comment: socially   . Drug use: No    Allergies:  Allergies  Allergen Reactions  . Cefaclor Hives, Swelling and Other (See Comments)    Reaction:  Lip swelling  Reaction:  Lip swelling  Reaction:Lip swelling   Reaction:Lip swelling    . Sulfasalazine Hives  . Codeine Hives, Nausea And Vomiting, Swelling and Other (See Comments)    Reaction:  Lip swelling  . Ibuprofen Hives, Swelling and Other (See Comments)    Reaction:  Swelling of lips and hands   . Penicillins Hives, Swelling and Other (See Comments)    Reaction:  Swelling of lips and hands  Has patient had a PCN reaction causing immediate rash, facial/tongue/throat swelling, SOB or lightheadedness with hypotension: Yes Has patient had a PCN reaction causing severe rash involving mucus membranes or skin necrosis: No Has patient had a PCN reaction that required hospitalization No Has patient had a PCN reaction  occurring within the last 10 years: No If all of the above answers are "NO", then may proceed with Cephalosporin use.  . Sulfa Antibiotics Hives    Medications Prior to Admission  Medication Sig Dispense Refill Last Dose  . acetaminophen (TYLENOL) 500 MG tablet Take 1,000 mg by mouth every 6 (six) hours as needed for mild pain, moderate pain or headache.   01/26/2020 at Unknown time  . Prenatal Vit-Fe Fumarate-FA (PRENATAL MULTIVITAMIN) TABS tablet Take 1 tablet by mouth at bedtime.   01/26/2020 at Unknown time    Review of Systems  Constitutional: Negative for chills and fever.  Respiratory: Negative for cough and shortness of breath.   Gastrointestinal: Negative for abdominal pain ( Cramping, but contributes to kidney pain), nausea and vomiting.  Genitourinary: Positive for flank pain (Left). Negative for difficulty urinating,  dysuria, pelvic pain, vaginal bleeding and vaginal discharge.  Musculoskeletal: Negative for back pain.  Neurological: Negative for dizziness, light-headedness and headaches.   Physical Exam   Pulse (!) 2, temperature 98 F (36.7 C), resp. rate 18, last menstrual period 04/28/2019, currently breastfeeding.  Physical Exam  Constitutional: She is oriented to person, place, and time. She appears well-developed and well-nourished. No distress.  Appears uncomfortable  HENT:  Head: Normocephalic and atraumatic.  Eyes: Conjunctivae are normal.  Cardiovascular: Normal rate, regular rhythm and normal heart sounds.  Respiratory: Effort normal and breath sounds normal.  GI: Soft. There is CVA tenderness (Left Side).  Gravid--fundal height appears AGA, Soft, NT   Musculoskeletal:     Cervical back: Normal range of motion.  Neurological: She is alert and oriented to person, place, and time.  Skin: Skin is warm and dry.  Psychiatric: She has a normal mood and affect. Her behavior is normal.    Fetal Assessment 145 bpm, Mod Var, -Decels, +Accels Toco: None graphed  MAU Course   Results for orders placed or performed during the hospital encounter of 01/26/20 (from the past 24 hour(s))  Urinalysis, Routine w reflex microscopic     Status: Abnormal   Collection Time: 01/26/20  3:55 PM  Result Value Ref Range   Color, Urine STRAW (A) YELLOW   APPearance CLEAR CLEAR   Specific Gravity, Urine 1.006 1.005 - 1.030   pH 7.0 5.0 - 8.0   Glucose, UA NEGATIVE NEGATIVE mg/dL   Hgb urine dipstick NEGATIVE NEGATIVE   Bilirubin Urine NEGATIVE NEGATIVE   Ketones, ur NEGATIVE NEGATIVE mg/dL   Protein, ur NEGATIVE NEGATIVE mg/dL   Nitrite NEGATIVE NEGATIVE   Leukocytes,Ua NEGATIVE NEGATIVE  CBC with Differential/Platelet     Status: Abnormal   Collection Time: 01/26/20  5:38 PM  Result Value Ref Range   WBC 10.4 4.0 - 10.5 K/uL   RBC 3.46 (L) 3.87 - 5.11 MIL/uL   Hemoglobin 11.1 (L) 12.0 - 15.0  g/dL   HCT 33.6 (L) 36.0 - 46.0 %   MCV 97.1 80.0 - 100.0 fL   MCH 32.1 26.0 - 34.0 pg   MCHC 33.0 30.0 - 36.0 g/dL   RDW 14.8 11.5 - 15.5 %   Platelets 219 150 - 400 K/uL   nRBC 0.0 0.0 - 0.2 %   Neutrophils Relative % 72 %   Neutro Abs 7.5 1.7 - 7.7 K/uL   Lymphocytes Relative 21 %   Lymphs Abs 2.1 0.7 - 4.0 K/uL   Monocytes Relative 6 %   Monocytes Absolute 0.6 0.1 - 1.0 K/uL   Eosinophils Relative 0 %   Eosinophils Absolute 0.0  0.0 - 0.5 K/uL   Basophils Relative 0 %   Basophils Absolute 0.0 0.0 - 0.1 K/uL   Immature Granulocytes 1 %   Abs Immature Granulocytes 0.05 0.00 - 0.07 K/uL  Comprehensive metabolic panel     Status: Abnormal   Collection Time: 01/26/20  5:38 PM  Result Value Ref Range   Sodium 138 135 - 145 mmol/L   Potassium 3.9 3.5 - 5.1 mmol/L   Chloride 107 98 - 111 mmol/L   CO2 22 22 - 32 mmol/L   Glucose, Bld 91 70 - 99 mg/dL   BUN 7 6 - 20 mg/dL   Creatinine, Ser 0.49 0.44 - 1.00 mg/dL   Calcium 9.1 8.9 - 10.3 mg/dL   Total Protein 6.5 6.5 - 8.1 g/dL   Albumin 3.0 (L) 3.5 - 5.0 g/dL   AST 19 15 - 41 U/L   ALT 13 0 - 44 U/L   Alkaline Phosphatase 128 (H) 38 - 126 U/L   Total Bilirubin 0.4 0.3 - 1.2 mg/dL   GFR calc non Af Amer >60 >60 mL/min   GFR calc Af Amer >60 >60 mL/min   Anion gap 9 5 - 15   US Renal  Result Date: 01/26/2020 CLINICAL DATA:  Initial evaluation for acute left flank pain, history of kidney stones. EXAM: RENAL / URINARY TRACT ULTRASOUND COMPLETE COMPARISON:  Prior CT from 11/02/2017. FINDINGS: Right Kidney: Renal measurements: 11.4 x 5.1 x 6.1 cm = volume: 185.8 mL . Echogenicity within normal limits. No mass or hydronephrosis visualized. No visible nephrolithiasis. Left Kidney: Renal measurements: 12.8 x 5.2 x 5.2 cm = volume: 181.3 mL. Echogenicity within normal limits. No mass or hydronephrosis visualized. No visible nephrolithiasis. Bladder: Appears normal for degree of bladder distention. Right ureteral jet well visualized. Left  ureteral jet not well seen. Other: Gravid uterus partially visualized. IMPRESSION: Normal renal ultrasound. No sonographic evidence for nephrolithiasis or obstructive uropathy. Electronically Signed   By: Jeannine Boga M.D.   On: 01/26/2020 19:02    MDM PE Labs:UA EFM [place] Assessment and Plan  34 year old G2P1001  SIUP at 34.5weeks Cat I FT Left Flank Pain H/O Kidney Stones  -POC reviewed -Exam findings discussed. -Patient endorses codeine allergy, but has taken dilaudid without issues with last kidney stone.  -Will give dilaudid IM for pain -Will send for renal US and reassess. -UA pending.  -NST reactive   Maryann Conners MSN, CNM 01/26/2020, 4:18 PM   Reassessment (5:24 PM) -Nurse reports that pain not improved with dilaudid dosing. -Start IV and obtain labs; CBC with Diff, CMP -Awaiting [place]. -Will continue to monitor -Okay to discontinue EFM  Reassessment (7:43 PM)  -Renal US returns without significant findings. -Patient informed that she will be discharged home and should strain urine.  -Patient reports continued pain and expresses concern about being discharge with continued pain.  -Dr. Gardiner Fanti consulted and advised: *Macrobid BID x 7 days *Flomax 0.68m daily x 3 days, with one refill if needed. *PO Dilaudid *Instruct to follow up with primary ob for urology consult. -Patient informed of MD recommendation and questions what she should do if pain worsens. -Reassurance given and patient informed that she should return to MAU as needed.  -Rx sent to pharmacy of patient's request. -Encouraged to call or return to MAU if symptoms worsen or with the onset of new symptoms. -Discharged to home in stable condition.  JMaryann ConnersMSN, CNM Advanced Practice Provider, [facility]

## 2020-01-26 NOTE — MAU Note (Signed)
Pt reports she started having pain in her right back and flank last night. Came in to MAU to have it evaluated and pain went away . Pain started up again about 2 hours ago. Denies any pain or bleeding with urination. Some urgency at times.

## 2020-01-27 ENCOUNTER — Inpatient Hospital Stay (HOSPITAL_COMMUNITY)
Admission: AD | Admit: 2020-01-27 | Discharge: 2020-01-29 | DRG: 818 | Disposition: A | Discharge status: Home / Self Care | Payer: PRIVATE HEALTH INSURANCE | Attending: Obstetrics and Gynecology | Admitting: Obstetrics and Gynecology

## 2020-01-27 ENCOUNTER — Encounter (HOSPITAL_COMMUNITY): Payer: Self-pay | Admitting: Obstetrics and Gynecology

## 2020-01-27 DIAGNOSIS — O26833 Pregnancy related renal disease, third trimester: Principal | ICD-10-CM | POA: Diagnosis present

## 2020-01-27 DIAGNOSIS — Z87442 Personal history of urinary calculi: Secondary | ICD-10-CM

## 2020-01-27 DIAGNOSIS — O26893 Other specified pregnancy related conditions, third trimester: Secondary | ICD-10-CM

## 2020-01-27 DIAGNOSIS — R109 Unspecified abdominal pain: Secondary | ICD-10-CM | POA: Diagnosis present

## 2020-01-27 DIAGNOSIS — Z3490 Encounter for supervision of normal pregnancy, unspecified, unspecified trimester: Secondary | ICD-10-CM

## 2020-01-27 DIAGNOSIS — Z20822 Contact with and (suspected) exposure to covid-19: Secondary | ICD-10-CM | POA: Diagnosis present

## 2020-01-27 DIAGNOSIS — N202 Calculus of kidney with calculus of ureter: Secondary | ICD-10-CM | POA: Diagnosis present

## 2020-01-27 DIAGNOSIS — Z9889 Other specified postprocedural states: Secondary | ICD-10-CM

## 2020-01-27 DIAGNOSIS — Z3A34 34 weeks gestation of pregnancy: Secondary | ICD-10-CM

## 2020-01-27 DIAGNOSIS — Z3A35 35 weeks gestation of pregnancy: Secondary | ICD-10-CM

## 2020-01-27 DIAGNOSIS — Z880 Allergy status to penicillin: Secondary | ICD-10-CM

## 2020-01-27 LAB — COMPREHENSIVE METABOLIC PANEL
ALT: 12 U/L (ref 0–44)
AST: 19 U/L (ref 15–41)
Albumin: 2.8 g/dL — ABNORMAL LOW (ref 3.5–5.0)
Alkaline Phosphatase: 122 U/L (ref 38–126)
Anion gap: 11 (ref 5–15)
BUN: 7 mg/dL (ref 6–20)
CO2: 20 mmol/L — ABNORMAL LOW (ref 22–32)
Calcium: 8.8 mg/dL — ABNORMAL LOW (ref 8.9–10.3)
Chloride: 105 mmol/L (ref 98–111)
Creatinine, Ser: 0.59 mg/dL (ref 0.44–1.00)
GFR calc Af Amer: >60 mL/min (ref >60)
GFR calc non Af Amer: >60 mL/min (ref >60)
Glucose, Bld: 109 mg/dL — ABNORMAL HIGH (ref 70–99)
Potassium: 3.8 mmol/L (ref 3.5–5.1)
Sodium: 136 mmol/L (ref 135–145)
Total Bilirubin: 0.7 mg/dL (ref 0.3–1.2)
Total Protein: 6 g/dL — ABNORMAL LOW (ref 6.5–8.1)

## 2020-01-27 LAB — CBC WITH DIFFERENTIAL/PLATELET
Abs Immature Granulocytes: 0.07 10*3/uL (ref 0.00–0.07)
Basophils Absolute: 0 10*3/uL (ref 0.0–0.1)
Basophils Relative: 0 %
Eosinophils Absolute: 0 10*3/uL (ref 0.0–0.5)
Eosinophils Relative: 0 %
HCT: 32.5 % — ABNORMAL LOW (ref 36.0–46.0)
Hemoglobin: 10.8 g/dL — ABNORMAL LOW (ref 12.0–15.0)
Immature Granulocytes: 1 %
Lymphocytes Relative: 9 %
Lymphs Abs: 1.2 10*3/uL (ref 0.7–4.0)
MCH: 32.2 pg (ref 26.0–34.0)
MCHC: 33.2 g/dL (ref 30.0–36.0)
MCV: 97 fL (ref 80.0–100.0)
Monocytes Absolute: 0.7 10*3/uL (ref 0.1–1.0)
Monocytes Relative: 5 %
Neutro Abs: 11.9 10*3/uL — ABNORMAL HIGH (ref 1.7–7.7)
Neutrophils Relative %: 85 %
Platelets: 208 10*3/uL (ref 150–400)
RBC: 3.35 MIL/uL — ABNORMAL LOW (ref 3.87–5.11)
RDW: 14.8 % (ref 11.5–15.5)
WBC: 13.9 10*3/uL — ABNORMAL HIGH (ref 4.0–10.5)
nRBC: 0 % (ref 0.0–0.2)

## 2020-01-27 LAB — TYPE AND SCREEN
ABO/RH(D): A POS
Antibody Screen: NEGATIVE

## 2020-01-27 LAB — SARS CORONAVIRUS 2 BY RT PCR (DIASORIN): SARS Coronavirus 2: NEGATIVE

## 2020-01-27 LAB — ABO/RH: ABO/RH(D): A POS

## 2020-01-27 MED ORDER — HYDROMORPHONE HCL 1 MG/ML IJ SOLN
1.0000 mg | Freq: Once | INTRAMUSCULAR | Status: AC
  Administered 2020-01-27: 1 mg via INTRAVENOUS
  Filled 2020-01-27: qty 1

## 2020-01-27 MED ORDER — CALCIUM CARBONATE ANTACID 500 MG PO CHEW: 2.0000 | CHEWABLE_TABLET | ORAL | Status: DC | PRN

## 2020-01-27 MED ORDER — PROMETHAZINE HCL 25 MG/ML IJ SOLN
12.5000 mg | Freq: Once | INTRAMUSCULAR | Status: AC
  Administered 2020-01-27: 12.5 mg via INTRAVENOUS
  Filled 2020-01-27: qty 1

## 2020-01-27 MED ORDER — HYDROMORPHONE HCL 1 MG/ML IJ SOLN
1.0000 mg | INTRAMUSCULAR | Status: DC | PRN
  Administered 2020-01-27 – 2020-01-28 (×4): 1 mg via INTRAVENOUS
  Filled 2020-01-27 (×5): qty 1

## 2020-01-27 MED ORDER — PRENATAL MULTIVITAMIN CH
1.0000 | ORAL_TABLET | Freq: Every day | ORAL | Status: DC
  Administered 2020-01-27: 1 via ORAL
  Filled 2020-01-27: qty 1

## 2020-01-27 MED ORDER — ZOLPIDEM TARTRATE 5 MG PO TABS: 5.0000 mg | ORAL_TABLET | Freq: Every evening | ORAL | Status: DC | PRN

## 2020-01-27 MED ORDER — LACTATED RINGERS IV BOLUS
1000.0000 mL | Freq: Once | INTRAVENOUS | Status: AC
  Administered 2020-01-27: 1000 mL via INTRAVENOUS

## 2020-01-27 MED ORDER — LACTATED RINGERS IV SOLN: INTRAVENOUS | Status: DC

## 2020-01-27 MED ORDER — DOCUSATE SODIUM 100 MG PO CAPS
100.0000 mg | ORAL_CAPSULE | Freq: Every day | ORAL | Status: DC
  Administered 2020-01-27 – 2020-01-28 (×2): 100 mg via ORAL
  Filled 2020-01-27 (×2): qty 1

## 2020-01-27 MED ORDER — ACETAMINOPHEN 325 MG PO TABS
650.0000 mg | ORAL_TABLET | ORAL | Status: DC | PRN
  Administered 2020-01-28: 650 mg via ORAL
  Filled 2020-01-27: qty 2

## 2020-01-27 MED ORDER — ONDANSETRON HCL 4 MG/2ML IJ SOLN
4.0000 mg | Freq: Four times a day (QID) | INTRAMUSCULAR | Status: DC | PRN
  Administered 2020-01-27 – 2020-01-28 (×2): 4 mg via INTRAVENOUS
  Filled 2020-01-27 (×2): qty 2

## 2020-01-27 NOTE — Progress Notes (Signed)
Patient ID: [name], female   DOB: September 27, 1986, 33 y.o.   MRN: [id number] Patient feeling mild left flank pain at this time.  No IV Dilaudid x 3 hours.  No current nausea.  No hematuria.  Feeling no CTX.  Active FM.  No fever or chills.  Patient required cysto removal of stone at 31 weeks in last pregnancy.    VSS. AF.   Renal U/S read as normal but no left ureteral jet was seen  Creatinine wnl at 0.59 WBC 10.4->13.9  Toco: CTX q 3-5 minutes (patient not feeling) FHT 140 and Cat I  Gen: A&O x 3 Abd: soft, NT Ext: no c/c/e, SCDs on   33yo G2P1001 at 41w6dand suspected left nephrolithiasis -Continue aggressive hydration and urine straining -IV Dilaudid prn -Flomax -If no improvement or worsens, will consult urology -CEFM -U cx pending -Repeat CBC and CMP in AM.  Consider adding abx if WBC continues to increase.  MLinda Hedges DO

## 2020-01-27 NOTE — H&P (Signed)
Alice Sherman is a 34 y.o. female presenting for flank pain. See MAU provider note for complete HPI.   OB History    Gravida  2   Para  1   Term  1   Preterm      AB      Living  1     SAB      TAB      Ectopic      Multiple  0   Live Births  1          Past Medical History:  Diagnosis Date  . Anemia    with pregnancy  . Breast mass   . Irritable bowel syndrome (IBS)    symptoms mostly diarrhea  . Kidney stones 10/2016   r kidney stone  . Ulcerative colitis [facility])    Past Surgical History:  Procedure Laterality Date  . CYSTOSCOPY WITH URETEROSCOPY AND STENT PLACEMENT Right 10/28/2016   Procedure: CYSTOSCOPY WITH RIGHT URETEROSCOPY;  Surgeon: Irine Seal, MD;  Location: [facility];  Service: Urology;  Laterality: Right;   Family History: family history includes Diverticulitis in her father; Hypertension in her mother; Kidney Stones in her father; Leukemia in her paternal grandfather. Social History:  reports that she has never smoked. She has never used smokeless tobacco. She reports current alcohol use. She reports that she does not use drugs.     Maternal Diabetes: No Genetic Screening: Normal Maternal Ultrasounds/Referrals: Normal Fetal Ultrasounds or other Referrals:  None Maternal Substance Abuse:  No Significant Maternal Medications:  None Significant Maternal Lab Results:  None Other Comments:  None  Review of Systems History   Blood pressure (!) 104/58, pulse 87, temperature 98.1 F (36.7 C), temperature source Oral, resp. rate 18, height 5' 1"  (1.549 m), weight 66.7 kg, last menstrual period 04/28/2019, SpO2 98 %, currently breastfeeding. Exam Physical Exam  Prenatal labs: ABO, Rh: --/--/A POS, A POS Performed at [facility], [place] [address] [place], [place] [address]  [phone]) Antibody: NEG (03/02 0302) Rubella:   RPR:    HBsAg:    HIV:    GBS:     Assessment/Plan: 34 yo @ 34.6 wga presenting with left flank pain, suspect kidney  stone. Has been seen in MAU 3 times in last 24 hours.   Admit for pain control Dilaudid PRN CEFM Add abx prn flomax Urology consult prn   Tyson Dense 01/27/2020, 7:03 AM

## 2020-01-27 NOTE — MAU Provider Note (Addendum)
Chief Complaint:  Flank Pain   First Provider Initiated Contact with Patient 01/27/20 0141     HPI: Alice Sherman is a 34 y.o. G2P1001 at 59w6dwho presents to maternity admissions reporting left flank pain. Pain started Sunday afternoon but resolved by the time she presented to MAU that evening. Pain returned Monday around 1 pm & has been consistent since then. Was seen in MAU Monday afternoon & had some relief after receiving IV dilaudid. Was discharged home with macrobid, flomax, & dilaudid. Took dilaudid at 9 pm. Has not started the macrobid or flomax. Has vomited some due to pain. Denies fever/chills, nausea, abdominal pain, vaginal bleeding, dysuria, or LOF. Good fetal movement.  Has history of kidney stone during her last pregnancy & reports current symptoms are just like that. [place] since her symptoms started. Goes to [facility] for prenatal care.   Location: left flank Quality: sharp, throbbing Severity: 10/10 in pain scale Duration: 1 day Timing: constant Modifying factors: worse with lying & sitting Associated signs and symptoms: vomits due to pain  Past Medical History:  Diagnosis Date  . Anemia    with pregnancy  . Breast mass   . Irritable bowel syndrome (IBS)    symptoms mostly diarrhea  . Kidney stones 10/2016   r kidney stone  . [place] ([facility]    OB History  Gravida Para Term Preterm AB Living  2 1 1     1   SAB TAB Ectopic Multiple Live Births        0 1    # Outcome Date GA Lbr Len/2nd Weight Sex Delivery Anes PTL Lv  2 Current           1 Term 12/24/16 327w3d5:12 / 03:03 2695 g F Vag-Spont EPI  LIV     Birth Comments: WDL   Past Surgical History:  Procedure Laterality Date  . CYSTOSCOPY WITH URETEROSCOPY AND STENT PLACEMENT Right 10/28/2016   Procedure: CYSTOSCOPY WITH RIGHT URETEROSCOPY;  Surgeon: JoIrine SealMD;  Location: [facility];  Service: Urology;  Laterality: Right;   Family  History  Problem Relation Age of Onset  . Hypertension Mother   . Kidney Stones Father   . Diverticulitis Father   . Leukemia Paternal Grandfather    Social History   Tobacco Use  . Smoking status: Never Smoker  . Smokeless tobacco: Never Used  Substance Use Topics  . Alcohol use: Yes    Comment: socially   . Drug use: No   Allergies  Allergen Reactions  . Cefaclor Hives, Swelling and Other (See Comments)    Reaction:  Lip swelling  Reaction:  Lip swelling  Reaction:Lip swelling   Reaction:Lip swelling    . Sulfasalazine Hives  . Codeine Hives, Nausea And Vomiting, Swelling and Other (See Comments)    Reaction:  Lip swelling  . Ibuprofen Hives, Swelling and Other (See Comments)    Reaction:  Swelling of lips and hands   . Penicillins Hives, Swelling and Other (See Comments)    Reaction:  Swelling of lips and hands  Has patient had a PCN reaction causing immediate rash, facial/tongue/throat swelling, SOB or lightheadedness with hypotension: Yes Has patient had a PCN reaction causing severe rash involving mucus membranes or skin necrosis: No Has patient had a PCN reaction that required hospitalization No Has patient had a PCN reaction occurring within the last 10 years: No If all of the  above answers are "NO", then may proceed with Cephalosporin use.  . Sulfa Antibiotics Hives   Medications Prior to Admission  Medication Sig Dispense Refill Last Dose  . HYDROmorphone (DILAUDID) 2 MG tablet Take 1 tablet (2 mg total) by mouth every 4 (four) hours as needed for up to 3 days for severe pain. 18 tablet 0 01/27/2020 at 9pm  . acetaminophen (TYLENOL) 500 MG tablet Take 1,000 mg by mouth every 6 (six) hours as needed for mild pain, moderate pain or headache.     . nitrofurantoin, macrocrystal-monohydrate, (MACROBID) 100 MG capsule Take 1 capsule (100 mg total) by mouth 2 (two) times daily. 14 capsule 0   . Prenatal Vit-Fe Fumarate-FA (PRENATAL MULTIVITAMIN) TABS tablet Take 1  tablet by mouth at bedtime.     . tamsulosin (FLOMAX) 0.4 MG CAPS capsule Take 1 capsule (0.4 mg total) by mouth daily. 3 capsule 1     I have reviewed patient's Past Medical Hx, Surgical Hx, Family Hx, Social Hx, medications and allergies.   ROS:  Review of Systems  Constitutional: Negative.   Gastrointestinal: Positive for vomiting. Negative for abdominal pain, constipation, diarrhea and nausea.  Genitourinary: Positive for flank pain. Negative for dysuria, hematuria, vaginal bleeding and vaginal discharge.    Physical Exam   Patient Vitals for the past 24 hrs:  BP Temp Temp src Pulse Resp  01/27/20 0133 109/75 -- -- (!) 105 --  01/27/20 0130 -- 98.9 F (37.2 C) Oral -- --  01/27/20 0115 107/69 -- -- 95 19    Constitutional: Well-developed, well-nourished female. Pt visibly uncomfortable & writhing in the bed.  Cardiovascular: normal rate & rhythm, no murmur Respiratory: normal effort, lung sounds clear throughout GI: + left CVA tenderness. Abd soft, non-tender, gravid appropriate for gestational age. Pos BS x 4 MS: Extremities nontender, no edema, normal ROM Neurologic: Alert and oriented x 4.   NST:  Baseline: 155 bpm, Variability: Good {> 6 bpm), Accelerations: Reactive and Decelerations: Absent   Labs: Results for orders placed or performed during the hospital encounter of 01/27/20 (from the past 24 hour(s))  CBC with Differential/Platelet     Status: Abnormal   Collection Time: 01/27/20  2:03 AM  Result Value Ref Range   WBC 13.9 (H) 4.0 - 10.5 K/uL   RBC 3.35 (L) 3.87 - 5.11 MIL/uL   Hemoglobin 10.8 (L) 12.0 - 15.0 g/dL   HCT 32.5 (L) 36.0 - 46.0 %   MCV 97.0 80.0 - 100.0 fL   MCH 32.2 26.0 - 34.0 pg   MCHC 33.2 30.0 - 36.0 g/dL   RDW 14.8 11.5 - 15.5 %   Platelets 208 150 - 400 K/uL   nRBC 0.0 0.0 - 0.2 %   Neutrophils Relative % 85 %   Neutro Abs 11.9 (H) 1.7 - 7.7 K/uL   Lymphocytes Relative 9 %   Lymphs Abs 1.2 0.7 - 4.0 K/uL   Monocytes Relative 5 %    Monocytes Absolute 0.7 0.1 - 1.0 K/uL   Eosinophils Relative 0 %   Eosinophils Absolute 0.0 0.0 - 0.5 K/uL   Basophils Relative 0 %   Basophils Absolute 0.0 0.0 - 0.1 K/uL   Immature Granulocytes 1 %   Abs Immature Granulocytes 0.07 0.00 - 0.07 K/uL    Imaging:  US Renal  Result Date: 01/26/2020 CLINICAL DATA:  Initial evaluation for acute left flank pain, history of kidney stones. EXAM: RENAL / URINARY TRACT ULTRASOUND COMPLETE COMPARISON:  Prior CT from 11/02/2017. FINDINGS:  Right Kidney: Renal measurements: 11.4 x 5.1 x 6.1 cm = volume: 185.8 mL . Echogenicity within normal limits. No mass or hydronephrosis visualized. No visible nephrolithiasis. Left Kidney: Renal measurements: 12.8 x 5.2 x 5.2 cm = volume: 181.3 mL. Echogenicity within normal limits. No mass or hydronephrosis visualized. No visible nephrolithiasis. Bladder: Appears normal for degree of bladder distention. Right ureteral jet well visualized. Left ureteral jet not well seen. Other: Gravid uterus partially visualized. IMPRESSION: Normal renal ultrasound. No sonographic evidence for nephrolithiasis or obstructive uropathy. Electronically Signed   By: Jeannine Boga M.D.   On: 01/26/2020 19:02    MAU Course: Orders Placed This Encounter  Procedures  . Culture, OB Urine  . SARS CORONAVIRUS 2 (TAT 6-24 HRS) Nasopharyngeal Nasopharyngeal Swab  . Urinalysis, Routine w reflex microscopic  . CBC with Differential/Platelet  . Comprehensive metabolic panel   Meds ordered this encounter  Medications  . lactated ringers bolus 1,000 mL  . promethazine (PHENERGAN) injection 12.5 mg  . HYDROmorphone (DILAUDID) injection 1 mg    MDM: Category 1 tracing with no regular contractions. Denies abdominal pain. Abdomen soft & non tender.   Per review of previous MAU visit - negative urinalysis & normal renal ultrasound. She remains afebrile. She is very uncomfortable & has some left CVA tenderness (which wasn't present when I saw  her last night), but otherwise no symptoms of pyelo. Suspect kidney stone based on her presentation & description of pain being just like previous stone. IV fluids & dilaudid ordered.   Since this is her 3rd MAU visit in 24 hours, I discussed presentation with her ob/gyn, Dr. Royston Sinner, who agrees to admit her to [facility] unit for pain management & reassessment.   Assessment: 1. Left flank pain   2. [redacted] weeks gestation of pregnancy     Plan: Observe on [facility] unit IV dilaudid prn pain CBC, CMP, & urine culture pending  Jorje Guild, NP 01/27/2020 2:50 AM

## 2020-01-27 NOTE — MAU Note (Signed)
Patient states her pain did not improve after leaving and is back to excruiating.  Denies VB.  Pain is still just left flank area.  Has not spoken to a urologist yet.

## 2020-01-27 NOTE — Progress Notes (Addendum)
Patient seen contracting every 2-3 minutes on monitor. Patient states she does not feel the contractions. Patient asleep through contractions. MD Royston Sinner notified and made aware. Ordered to continue with monitoring and call if patient begins to develop pain with contractions x1hour.

## 2020-01-28 ENCOUNTER — Observation Stay (HOSPITAL_COMMUNITY): Payer: PRIVATE HEALTH INSURANCE

## 2020-01-28 ENCOUNTER — Inpatient Hospital Stay (HOSPITAL_COMMUNITY): Payer: PRIVATE HEALTH INSURANCE | Admitting: Certified Registered Nurse Anesthetist

## 2020-01-28 ENCOUNTER — Inpatient Hospital Stay (HOSPITAL_COMMUNITY): Payer: PRIVATE HEALTH INSURANCE

## 2020-01-28 ENCOUNTER — Encounter (HOSPITAL_COMMUNITY): Payer: Self-pay | Admitting: Obstetrics and Gynecology

## 2020-01-28 ENCOUNTER — Encounter (HOSPITAL_COMMUNITY)
Admission: AD | Disposition: A | Discharge status: Home / Self Care | Payer: Self-pay | Source: Home / Self Care | Attending: Obstetrics and Gynecology

## 2020-01-28 DIAGNOSIS — Z3490 Encounter for supervision of normal pregnancy, unspecified, unspecified trimester: Secondary | ICD-10-CM

## 2020-01-28 DIAGNOSIS — Z880 Allergy status to penicillin: Secondary | ICD-10-CM | POA: Diagnosis not present

## 2020-01-28 DIAGNOSIS — R109 Unspecified abdominal pain: Secondary | ICD-10-CM | POA: Diagnosis present

## 2020-01-28 DIAGNOSIS — Z20822 Contact with and (suspected) exposure to covid-19: Secondary | ICD-10-CM | POA: Diagnosis present

## 2020-01-28 DIAGNOSIS — N202 Calculus of kidney with calculus of ureter: Secondary | ICD-10-CM | POA: Diagnosis present

## 2020-01-28 DIAGNOSIS — O26833 Pregnancy related renal disease, third trimester: Secondary | ICD-10-CM | POA: Diagnosis present

## 2020-01-28 DIAGNOSIS — Z3A35 35 weeks gestation of pregnancy: Secondary | ICD-10-CM | POA: Diagnosis not present

## 2020-01-28 DIAGNOSIS — Z87442 Personal history of urinary calculi: Secondary | ICD-10-CM | POA: Diagnosis not present

## 2020-01-28 HISTORY — PX: CYSTOSCOPY WITH STENT PLACEMENT: SHX5790

## 2020-01-28 HISTORY — PX: CYSTOSCOPY/RETROGRADE/URETEROSCOPY/STONE EXTRACTION WITH BASKET: SHX5317

## 2020-01-28 LAB — CBC
HCT: 29.3 % — ABNORMAL LOW (ref 36.0–46.0)
Hemoglobin: 9.4 g/dL — ABNORMAL LOW (ref 12.0–15.0)
MCH: 31.5 pg (ref 26.0–34.0)
MCHC: 32.1 g/dL (ref 30.0–36.0)
MCV: 98.3 fL (ref 80.0–100.0)
Platelets: 179 10*3/uL (ref 150–400)
RBC: 2.98 MIL/uL — ABNORMAL LOW (ref 3.87–5.11)
RDW: 15.5 % (ref 11.5–15.5)
WBC: 7.2 10*3/uL (ref 4.0–10.5)
nRBC: 0 % (ref 0.0–0.2)

## 2020-01-28 LAB — CULTURE, OB URINE

## 2020-01-28 LAB — COMPREHENSIVE METABOLIC PANEL
ALT: 10 U/L (ref 0–44)
AST: 14 U/L — ABNORMAL LOW (ref 15–41)
Albumin: 2.3 g/dL — ABNORMAL LOW (ref 3.5–5.0)
Alkaline Phosphatase: 105 U/L (ref 38–126)
Anion gap: 7 (ref 5–15)
BUN: 7 mg/dL (ref 6–20)
CO2: 24 mmol/L (ref 22–32)
Calcium: 8.3 mg/dL — ABNORMAL LOW (ref 8.9–10.3)
Chloride: 106 mmol/L (ref 98–111)
Creatinine, Ser: 0.46 mg/dL (ref 0.44–1.00)
GFR calc Af Amer: >60 mL/min (ref >60)
GFR calc non Af Amer: >60 mL/min (ref >60)
Glucose, Bld: 86 mg/dL (ref 70–99)
Potassium: 3.6 mmol/L (ref 3.5–5.1)
Sodium: 137 mmol/L (ref 135–145)
Total Bilirubin: 0.5 mg/dL (ref 0.3–1.2)
Total Protein: 5.3 g/dL — ABNORMAL LOW (ref 6.5–8.1)

## 2020-01-28 SURGERY — CYSTOSCOPY, WITH STENT INSERTION
Anesthesia: Spinal | Site: Ureter | Laterality: Left

## 2020-01-28 MED ORDER — PHENYLEPHRINE 40 MCG/ML (10ML) SYRINGE FOR IV PUSH (FOR BLOOD PRESSURE SUPPORT)
PREFILLED_SYRINGE | INTRAVENOUS | Status: AC
  Filled 2020-01-28: qty 10

## 2020-01-28 MED ORDER — SOD CITRATE-CITRIC ACID 500-334 MG/5ML PO SOLN
30.0000 mL | Freq: Once | ORAL | Status: AC
  Administered 2020-01-28: 30 mL via ORAL
  Filled 2020-01-28: qty 30

## 2020-01-28 MED ORDER — BUPIVACAINE IN DEXTROSE 0.75-8.25 % IT SOLN
INTRATHECAL | Status: DC | PRN
  Administered 2020-01-28: 1.4 mL via INTRATHECAL

## 2020-01-28 MED ORDER — 0.9 % SODIUM CHLORIDE (POUR BTL) OPTIME
TOPICAL | Status: DC | PRN
  Administered 2020-01-28: 1000 mL

## 2020-01-28 MED ORDER — ONDANSETRON HCL 4 MG/2ML IJ SOLN: 4.0000 mg | Freq: Once | INTRAMUSCULAR | Status: DC | PRN

## 2020-01-28 MED ORDER — HYDROMORPHONE HCL 1 MG/ML IJ SOLN
2.0000 mg | Freq: Once | INTRAMUSCULAR | Status: AC
  Administered 2020-01-28: 2 mg via INTRAVENOUS
  Filled 2020-01-28: qty 2

## 2020-01-28 MED ORDER — PHENYLEPHRINE 40 MCG/ML (10ML) SYRINGE FOR IV PUSH (FOR BLOOD PRESSURE SUPPORT)
PREFILLED_SYRINGE | INTRAVENOUS | Status: DC | PRN
  Administered 2020-01-28: 40 ug via INTRAVENOUS
  Administered 2020-01-28: 80 ug via INTRAVENOUS
  Administered 2020-01-28: 120 ug via INTRAVENOUS
  Administered 2020-01-28: 40 ug via INTRAVENOUS
  Administered 2020-01-28 (×2): 80 ug via INTRAVENOUS
  Administered 2020-01-28: 40 ug via INTRAVENOUS

## 2020-01-28 MED ORDER — PRENATAL MULTIVITAMIN CH
1.0000 | ORAL_TABLET | Freq: Every day | ORAL | Status: DC
  Administered 2020-01-28: 1 via ORAL
  Filled 2020-01-28: qty 1

## 2020-01-28 MED ORDER — TAMSULOSIN HCL 0.4 MG PO CAPS
0.4000 mg | ORAL_CAPSULE | Freq: Every day | ORAL | Status: DC
  Administered 2020-01-28: 0.4 mg via ORAL
  Filled 2020-01-28 (×2): qty 1

## 2020-01-28 MED ORDER — CLINDAMYCIN PHOSPHATE 900 MG/50ML IV SOLN
900.0000 mg | INTRAVENOUS | Status: AC
  Administered 2020-01-28: 900 mg via INTRAVENOUS
  Filled 2020-01-28: qty 50

## 2020-01-28 MED ORDER — HYDROMORPHONE HCL 1 MG/ML IJ SOLN: 0.2500 mg | INTRAMUSCULAR | Status: DC | PRN

## 2020-01-28 SURGICAL SUPPLY — 29 items
BAG URINE DRAIN 2000ML AR STRL (UROLOGICAL SUPPLIES) ×2 IMPLANT
BAG URO CATCHER STRL LF (MISCELLANEOUS) ×2 IMPLANT
CATH INTERMIT  6FR 70CM (CATHETERS) ×2 IMPLANT
COVER WAND RF STERILE (DRAPES) ×2 IMPLANT
ELECT REM PT RETURN 9FT ADLT (ELECTROSURGICAL)
ELECTRODE REM PT RTRN 9FT ADLT (ELECTROSURGICAL) IMPLANT
GLOVE BIOGEL M STRL SZ7.5 (GLOVE) ×2 IMPLANT
GLOVE SURG SS PI 8.0 STRL IVOR (GLOVE) ×2 IMPLANT
GOWN STRL REUS W/ TWL LRG LVL3 (GOWN DISPOSABLE) ×2 IMPLANT
GOWN STRL REUS W/ TWL XL LVL3 (GOWN DISPOSABLE) ×1 IMPLANT
GOWN STRL REUS W/TWL LRG LVL3 (GOWN DISPOSABLE) ×2
GOWN STRL REUS W/TWL XL LVL3 (GOWN DISPOSABLE) ×1
GUIDEWIRE ANG ZIPWIRE 038X150 (WIRE) IMPLANT
GUIDEWIRE STR DUAL SENSOR (WIRE) ×2 IMPLANT
IV NS 1000ML (IV SOLUTION) ×1
IV NS 1000ML BAXH (IV SOLUTION) ×1 IMPLANT
KIT TURNOVER KIT B (KITS) ×2 IMPLANT
MANIFOLD NEPTUNE II (INSTRUMENTS) ×4 IMPLANT
NS IRRIG 1000ML POUR BTL (IV SOLUTION) ×2 IMPLANT
PACK CYSTO (CUSTOM PROCEDURE TRAY) ×2 IMPLANT
PAD ARMBOARD 7.5X6 YLW CONV (MISCELLANEOUS) ×2 IMPLANT
SET IRRIG Y TYPE TUR BLADDER L (SET/KITS/TRAYS/PACK) ×2 IMPLANT
SHEATH URETERAL 12FRX35CM (MISCELLANEOUS) IMPLANT
SOL PREP POV-IOD 4OZ 10% (MISCELLANEOUS) ×2 IMPLANT
STENT URET 6FRX24 CONTOUR (STENTS) ×2 IMPLANT
SYPHON OMNI JUG (MISCELLANEOUS) ×2 IMPLANT
TOWEL GREEN STERILE FF (TOWEL DISPOSABLE) ×2 IMPLANT
TUBE CONNECTING 12X1/4 (SUCTIONS) ×2 IMPLANT
WATER STERILE IRR 3000ML UROMA (IV SOLUTION) ×2 IMPLANT

## 2020-01-28 NOTE — Op Note (Signed)
Preoperative diagnosis:  1. Left ureteral calculus  Postoperative diagnosis:  1. Left ureteral calculus  Procedure:  1. Cystoscopy 2. Left ureteral stent placement (6 x 24-no string)  Surgeon: Roxy Horseman, Brooke Bonito. M.D.  Anesthesia: Spinal  Complications: None  EBL: Minimal  Specimens: None  Indication: Alice Sherman is a 34 y.o. patient with a distal left ureteral calculus.  She is [redacted] weeks pregnant and after discussing options, her pain has been poorly controlled and she wishes to proceed with intervention.  After reviewing the management options for treatment, he elected to proceed with the above surgical procedure(s). We have discussed the potential benefits and risks of the procedure, side effects of the proposed treatment, the likelihood of the patient achieving the goals of the procedure, and any potential problems that might occur during the procedure or recuperation including the possibility of preterm labor. Informed consent has been obtained.  Description of procedure:  The patient was taken to the operating room and spinal anesthesia was performed.  The patient was placed in the dorsal lithotomy position, prepped and draped in the usual sterile fashion, and preoperative antibiotics were administered. A preoperative time-out was performed.   Cystourethroscopy was performed.  The patient's urethra was examined and was unremarkable. The bladder was then systematically examined in its entirety. There was no evidence for any bladder tumors, stones, or other mucosal pathology.    The stone was not visualized nor was the ureteral orifice edematous.  A 0.38 sensor guidewire was then advanced up the left ureter into the renal pelvis under fluoroscopic guidance.  The wire was then backloaded through the cystoscope and a ureteral stent was advance over the wire using Seldinger technique.  The stent was positioned appropriately under fluoroscopic and cystoscopic guidance.  The wire was  then removed with an adequate stent curl noted in the renal pelvis as well as in the bladder. Minimal fluoroscopy was utilized at low dose settings and only at the kidney.  The bladder was then emptied and the procedure ended.  The patient appeared to tolerate the procedure well and without complications.  The patient was able to be awakened and transferred to the recovery unit in satisfactory condition.    Alice Curia MD

## 2020-01-28 NOTE — OR Nursing (Signed)
Contacted OB RR RN in consult for surgical procedure.  Requested to contact OB to enter orders for pre-op and post-op fetal monitoring.  Dr. Helane Rima contacted.  She will enter the orders.

## 2020-01-28 NOTE — Transfer of Care (Signed)
Immediate Anesthesia Transfer of Care Note  Patient: Alice Sherman  Procedure(s) Performed: CYSTOSCOPY URETHRAL WITH STENT PLACEMENT (Left Ureter) POSSIBLE URETHRAL STONE REMOVAL (Left Ureter)  Patient Location: PACU  Anesthesia Type:Spinal  Level of Consciousness: awake, alert , oriented, patient cooperative and responds to stimulation  Airway & Oxygen Therapy: Patient Spontanous Breathing  Post-op Assessment: Report given to RN and Post -op Vital signs reviewed and stable  Post vital signs: Reviewed and stable  Last Vitals:  Vitals Value Taken Time  BP 87/58 01/28/20 1624  Temp 36.4 C 01/28/20 1615  Pulse 75 01/28/20 1627  Resp 20 01/28/20 1627  SpO2 97 % 01/28/20 1627  Vitals shown include unvalidated device data.  Last Pain:  Vitals:   01/28/20 1615  TempSrc:   PainSc: 0-No pain      Patients Stated Pain Goal: 3 (80/63/86 8548)  Complications: No apparent anesthesia complications

## 2020-01-28 NOTE — Consult Note (Signed)
Urology Consult   Physician requesting consult: Dr. Dian Queen  Reason for consult: Ureteral stone  History of Present Illness: Alice Sherman is a 34 y.o. with a history of prior urolithiasis.  She is currently [redacted] weeks pregnant.  Her Covid test was negative on 01/27/2020.  Her first stone episode occurred during her last pregnancy in 2017.  She did require intervention at that time.  She began having increased urinary urgency and frequency this past weekend and then developed significant left-sided flank pain on Sunday.  She presented to the emergency department multiple times over the next couple of days and eventually was admitted for IV fluid hydration and IV pain control.  She underwent a renal ultrasound that demonstrated no hydronephrosis but did demonstrate the absence of a left ureteral jet.  Her urinalysis was unremarkable.  She has had no fever.  She has had some nausea and vomiting when she developed severe pain.  Her pain has not been well controlled with oral medication.  She underwent a low-dose CT scan of the abdomen and pelvis today.  This did confirm a 3 to 4 mm distal left ureteral calculus.    Past Medical History:  Diagnosis Date  . Anemia    with pregnancy  . Breast mass   . Irritable bowel syndrome (IBS)    symptoms mostly diarrhea  . Kidney stones 10/2016   r kidney stone  . Ulcerative colitis [facility]     Past Surgical History:  Procedure Laterality Date  . CYSTOSCOPY WITH URETEROSCOPY AND STENT PLACEMENT Right 10/28/2016   Procedure: CYSTOSCOPY WITH RIGHT URETEROSCOPY;  Surgeon: Irine Seal, MD;  Location: [facility];  Service: Urology;  Laterality: Right;     Current Hospital Medications:  Home meds:  No current facility-administered medications on file prior to encounter.   Current Outpatient Medications on File Prior to Encounter  Medication Sig Dispense Refill  . HYDROmorphone (DILAUDID) 2 MG tablet Take 1 tablet (2 mg total) by mouth every 4 (four) hours as  needed for up to 3 days for severe pain. 18 tablet 0  . acetaminophen (TYLENOL) 500 MG tablet Take 1,000 mg by mouth every 6 (six) hours as needed for mild pain, moderate pain or headache.    . nitrofurantoin, macrocrystal-monohydrate, (MACROBID) 100 MG capsule Take 1 capsule (100 mg total) by mouth 2 (two) times daily. 14 capsule 0  . Prenatal Vit-Fe Fumarate-FA (PRENATAL MULTIVITAMIN) TABS tablet Take 1 tablet by mouth at bedtime.    . tamsulosin (FLOMAX) 0.4 MG CAPS capsule Take 1 capsule (0.4 mg total) by mouth daily. 3 capsule 1     Scheduled Meds: . docusate sodium  100 mg Oral Daily  . prenatal multivitamin  1 tablet Oral Q1200   Continuous Infusions: . [START ON 01/29/2020] clindamycin (CLEOCIN) IV    . lactated ringers 125 mL/hr at 01/28/20 1158   PRN Meds:.acetaminophen, calcium carbonate, HYDROmorphone (DILAUDID) injection, ondansetron (ZOFRAN) IV, zolpidem  Allergies:  Allergies  Allergen Reactions  . Cefaclor Hives, Swelling and Other (See Comments)    Reaction:  Lip swelling  Reaction:  Lip swelling  Reaction:Lip swelling   Reaction:Lip swelling    . Sulfasalazine Hives  . Codeine Hives, Nausea And Vomiting, Swelling and Other (See Comments)    Reaction:  Lip swelling  . Ibuprofen Hives, Swelling and Other (See Comments)    Reaction:  Swelling of lips and hands   . Penicillins Hives, Swelling and Other (See Comments)    Reaction:  Swelling of  lips and hands  Has patient had a PCN reaction causing immediate rash, facial/tongue/throat swelling, SOB or lightheadedness with hypotension: Yes Has patient had a PCN reaction causing severe rash involving mucus membranes or skin necrosis: No Has patient had a PCN reaction that required hospitalization No Has patient had a PCN reaction occurring within the last 10 years: No If all of the above answers are "NO", then may proceed with Cephalosporin use.  . Sulfa Antibiotics Hives    Family History  Problem Relation Age  of Onset  . Hypertension Mother   . Kidney Stones Father   . Diverticulitis Father   . Leukemia Paternal Grandfather     Social History:  reports that she has never smoked. She has never used smokeless tobacco. She reports current alcohol use. She reports that she does not use drugs.  ROS: A complete review of systems was performed.  All systems are negative except for pertinent findings as noted.  Physical Exam:  Vital signs in last 24 hours: Temp:  [98 F (36.7 C)-98.7 F (37.1 C)] 98.6 F (37 C) (03/03 1121) Pulse Rate:  [94-125] 107 (03/03 1121) Resp:  [16-18] 18 (03/03 1121) BP: (84-125)/(49-82) 106/67 (03/03 1121) SpO2:  [96 %-100 %] 96 % (03/03 1121) Constitutional:  Alert and oriented, No acute distress Cardiovascular:No JVD Respiratory: Normal respiratory effort GI: Abdomen is gravid.  No abdominal tenderness. GU: Moderate left CVA tenderness Lymphatic: No lymphadenopathy Neurologic: Grossly intact, no focal deficits Psychiatric: Normal mood and affect  Laboratory Data:  Recent Labs    01/26/20 1738 01/27/20 0203 01/28/20 0621  WBC 10.4 13.9* 7.2  HGB 11.1* 10.8* 9.4*  HCT 33.6* 32.5* 29.3*  PLT 219 208 179    Recent Labs    01/26/20 1738 01/27/20 0203 01/28/20 0621  NA 138 136 137  K 3.9 3.8 3.6  CL 107 105 106  GLUCOSE 91 109* 86  BUN 7 7 7   CALCIUM 9.1 8.8* 8.3*  CREATININE 0.49 0.59 0.46     Results for orders placed or performed during the hospital encounter of 01/27/20 (from the past 24 hour(s))  CBC     Status: Abnormal   Collection Time: 01/28/20  6:21 AM  Result Value Ref Range   WBC 7.2 4.0 - 10.5 K/uL   RBC 2.98 (L) 3.87 - 5.11 MIL/uL   Hemoglobin 9.4 (L) 12.0 - 15.0 g/dL   HCT 29.3 (L) 36.0 - 46.0 %   MCV 98.3 80.0 - 100.0 fL   MCH 31.5 26.0 - 34.0 pg   MCHC 32.1 30.0 - 36.0 g/dL   RDW 15.5 11.5 - 15.5 %   Platelets 179 150 - 400 K/uL   nRBC 0.0 0.0 - 0.2 %  Comprehensive metabolic panel     Status: Abnormal   Collection  Time: 01/28/20  6:21 AM  Result Value Ref Range   Sodium 137 135 - 145 mmol/L   Potassium 3.6 3.5 - 5.1 mmol/L   Chloride 106 98 - 111 mmol/L   CO2 24 22 - 32 mmol/L   Glucose, Bld 86 70 - 99 mg/dL   BUN 7 6 - 20 mg/dL   Creatinine, Ser 0.46 0.44 - 1.00 mg/dL   Calcium 8.3 (L) 8.9 - 10.3 mg/dL   Total Protein 5.3 (L) 6.5 - 8.1 g/dL   Albumin 2.3 (L) 3.5 - 5.0 g/dL   AST 14 (L) 15 - 41 U/L   ALT 10 0 - 44 U/L   Alkaline Phosphatase 105 38 -  126 U/L   Total Bilirubin 0.5 0.3 - 1.2 mg/dL   GFR calc non Af Amer >60 >60 mL/min   GFR calc Af Amer >60 >60 mL/min   Anion gap 7 5 - 15   Recent Results (from the past 240 hour(s))  Culture, OB Urine     Status: Abnormal   Collection Time: 01/26/20  9:10 PM   Specimen: Urine, Random  Result Value Ref Range Status   Specimen Description URINE, RANDOM  Final   Special Requests ADDED 0120 01/27/2020  Final   Culture (A)  Final    MULTIPLE SPECIES PRESENT, SUGGEST RECOLLECTION NO GROUP B STREP (S.AGALACTIAE) ISOLATED Performed at [facility], [place] [address] [place], [place] [address]    Report Status 01/28/2020 FINAL  Final  SARS Coronavirus 2 by RT PCR     Status: None   Collection Time: 01/27/20  2:45 AM  Result Value Ref Range Status   SARS Coronavirus 2 NEGATIVE NEGATIVE Final    Comment: (NOTE) Result indicates the ABSENCE of SARS-CoV-2 RNA in the patient specimen.  The lowest concentration of SARS-CoV-2 viral copies this assay can detect in nasopharyngeal swab specimens is 500 copies / mL.  A negative result does not preclude SARS-CoV-2 infection and should not be used as the sole basis for patient management decisions. A negative result may occur with improper specimen collection / handling, submission of a specimen other than nasopharyngeal swab, presence of viral mutation(s) within the areas targeted by this assay, and inadequate number of viral copies (<500 copies / mL) present.  Negative results must be combined with  clinical observations, patient history, and epidemiological information.  The expected result is NEGATIVE.  Patient Fact Sheet:  BlogSelections.co.uk   Provider Fact Sheet:  https://lucas.com/   This test is not yet approved or cleared by the [place] FDA and  has been authorized for  detection and/or diagnosis of SARS-CoV-2 by FDA under an Emergency Use Authorization (EUA).  This EUA will remain in effect (meaning this test can be used) for the duration of  the COVID-19 declaration under Section 564(b)(1) of the Act, 21 U.S.C. section 360bbb-3(b)(1), unless the authorization is terminated or revoked sooner Performed at [facility], [place] [address], [place], [place] [address]     Renal Function: Recent Labs    01/26/20 1738 01/27/20 0203 01/28/20 0621  CREATININE 0.49 0.59 0.46   Estimated Creatinine Clearance: 87.5 mL/min (by C-G formula based on SCr of 0.46 mg/dL).  Radiologic Imaging: CT ABDOMEN PELVIS WO CONTRAST  Result Date: 01/28/2020 CLINICAL DATA:  Flank pain kidney stone suspected EXAM: CT ABDOMEN AND PELVIS WITHOUT CONTRAST TECHNIQUE: Multidetector CT imaging of the abdomen and pelvis was performed following the standard protocol without IV contrast. COMPARISON:  Renal sonogram 01/26/2020, CT abdomen and pelvis from 11/02/2017 FINDINGS: Lower chest: Basilar atelectasis and small right-sided pleural effusion. Trace left effusion. No pericardial fluid. Hepatobiliary: Liver is unremarkable. No signs of biliary ductal dilation on noncontrast imaging. Pancreas: Pancreas is normal. Spleen: Spleen is normal size without focal lesion. Adrenals/Urinary Tract: Adrenal glands are normal. Mild fullness of the ureters bilaterally. Mild perinephric stranding on the left and mild fullness of collecting systems also on the left. Left ureteral dilation to the level of a small calculus in the distal left ureter measuring 3-4 mm.  Nonobstructing left intrarenal calculus also present 3-4 mm in the lower pole. Fullness of the right ureter and collecting system, not unexpected given the size of the  gravid uterus. Urinary bladder is under distended, Stomach/Bowel: CT appearance of stomach, small and large bowel are unremarkable. The appendix is normal. Vascular/Lymphatic: Vascular structures in the abdomen not well assessed given lack of contrast. No abnormal dilation or perivascular stranding. No signs of adenopathy in the upper abdomen or in the retroperitoneum. No signs of pelvic lymphadenopathy. Reproductive: Gravid uterus with third trimester pregnancy, fetus in vertex position. Study not performed for fetal evaluation. Placenta appears to be posterior. Other: No abdominal wall hernia or abnormality. No abdominopelvic ascites. Musculoskeletal: No signs of acute bone finding or destructive bone process. 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 IMPRESSION: 1. 3-4 mm distal left ureteral calculus with mild ureteral distension with early distension of left renal collecting systems, also associated with mild perinephric stranding. 2. Mild fullness of the right ureter and renal collecting systems likely due to uterine compression from gravid uterus. 3.  3-4 mm nonobstructing calculus in the lower pole left kidney. 4. Large, gravid uterus with third trimester pregnancy, not well assessed, fetus in vertex position. 5. Small right and trace left pleural effusions. Electronically Signed   By: Zetta Bills M.D.   On: 01/28/2020 09:41   US Renal  Result Date: 01/26/2020 CLINICAL DATA:  Initial evaluation for acute left flank pain, history of kidney stones. EXAM: RENAL / URINARY TRACT ULTRASOUND COMPLETE COMPARISON:  Prior CT from 11/02/2017. FINDINGS: Right Kidney: Renal measurements: 11.4 x 5.1 x 6.1 cm = volume: 185.8 mL . Echogenicity within normal limits. No mass or hydronephrosis visualized. No visible  nephrolithiasis. Left Kidney: Renal measurements: 12.8 x 5.2 x 5.2 cm = volume: 181.3 mL. Echogenicity within normal limits. No mass or hydronephrosis visualized. No visible nephrolithiasis. Bladder: Appears normal for degree of bladder distention. Right ureteral jet well visualized. Left ureteral jet not well seen. Other: Gravid uterus partially visualized. IMPRESSION: Normal renal ultrasound. No sonographic evidence for nephrolithiasis or obstructive uropathy. Electronically Signed   By: Jeannine Boga M.D.   On: 01/26/2020 19:02    I independently reviewed the above imaging studies.  Impression/Recommendation: 1.  Distal left ureteral calculus: Her pain remains poorly controlled.  She does not feel that she will be able to continue to manage her pain at home on oral medication.  As such, we discussed proceeding with intervention with cystoscopy and left ureteral stent placement.  We also discussed possible left ureteroscopy with stone removal if her stone is readily accessible.  However, we agreed that we would not proceed with a more in-depth procedure such as laser lithotripsy in order to limit her anesthetic time.  We reviewed the potential risks, complications, and expected recovery process associated with this procedure.  Specifically, we discussed the risk of developing preterm labor.  We have also discussed this with Dr. Helane Rima today who agrees with this plan.  Assuming she does undergo cystoscopy and stent placement, she will then plan to follow-up with her primary urologist, Dr. John Giovanni after delivery for definitive treatment.  Dutch Gray 01/28/2020, 12:51 PM  Pryor Curia. MD   CC: Dr. Dian Queen

## 2020-01-28 NOTE — Progress Notes (Signed)
Spoke with Dr. Helane Rima about patient's orders for NST and continuous monitoring. Per Dr. Helane Rima it is okay to take patient off monitor for the night after NST completed.

## 2020-01-28 NOTE — Progress Notes (Signed)
HD # 3  Patient woke up this morning in excruciating pain 10 /10 Crying  I have ordered Dilaudid  BP (!) 93/57 (BP Location: Left Arm)   Pulse 94   Temp 98.2 F (36.8 C) (Oral)   Resp 16   Ht 5' 1"  (1.549 m)   Wt 66.7 kg   LMP 04/28/2019 (Approximate)   SpO2 97%   BMI 27.78 kg/m  Results for orders placed or performed during the hospital encounter of 01/27/20 (from the past 24 hour(s))  CBC     Status: Abnormal   Collection Time: 01/28/20  6:21 AM  Result Value Ref Range   WBC 7.2 4.0 - 10.5 K/uL   RBC 2.98 (L) 3.87 - 5.11 MIL/uL   Hemoglobin 9.4 (L) 12.0 - 15.0 g/dL   HCT 29.3 (L) 36.0 - 46.0 %   MCV 98.3 80.0 - 100.0 fL   MCH 31.5 26.0 - 34.0 pg   MCHC 32.1 30.0 - 36.0 g/dL   RDW 15.5 11.5 - 15.5 %   Platelets 179 150 - 400 K/uL   nRBC 0.0 0.0 - 0.2 %   Exam  Severe distress Definite left flank tenderness  I have requested consult with urology - Dr. Karie Georges Keep NPO I suspect obstruction from stone Will speak to Dr. Alinda Money about imaging and may need urologic intervention Plan of care reviewed with patient

## 2020-01-28 NOTE — Anesthesia Procedure Notes (Signed)
Spinal  Patient location during procedure: OR Start time: 01/28/2020 3:42 PM End time: 01/28/2020 3:46 PM Staffing Performed: anesthesiologist  Anesthesiologist: Josephine Igo, MD Preanesthetic Checklist Completed: patient identified, IV checked, site marked, risks and benefits discussed, surgical consent, monitors and equipment checked, pre-op evaluation and timeout performed Spinal Block Patient position: sitting Prep: DuraPrep and site prepped and draped Patient monitoring: heart rate, cardiac monitor, continuous pulse ox and blood pressure Approach: midline Location: L3-4 Injection technique: single-shot Needle Needle type: Pencan  Needle gauge: 24 G Needle length: 9 cm Needle insertion depth: 6 cm Assessment Sensory level: T4 Additional Notes Patient tolerated procedure well. Adequate sensory level.

## 2020-01-28 NOTE — Progress Notes (Signed)
Orders for NST ordered  As instructed HOUSE COVERAGE AND RAPID RESPONSE OB NURSE NOTIFIED

## 2020-01-28 NOTE — Progress Notes (Signed)
Spoke to Dr. Alinda Money Recommend low dose CT to evaluate size and location of stone Keep Patient NPO

## 2020-01-28 NOTE — Progress Notes (Signed)
Fetal Heart Rate in PACU was 140.  Dr Royce Macadamia informed along with Gerald Stabs, CRNA.

## 2020-01-28 NOTE — Anesthesia Postprocedure Evaluation (Signed)
Anesthesia Post Note  Patient: PERCY COMP  Procedure(s) Performed: CYSTOSCOPY URETHRAL WITH STENT PLACEMENT (Left Ureter) POSSIBLE URETHRAL STONE REMOVAL (Left Ureter)     Patient location during evaluation: PACU Anesthesia Type: Spinal Level of consciousness: oriented and awake and alert Pain management: pain level controlled Vital Signs Assessment: post-procedure vital signs reviewed and stable Respiratory status: spontaneous breathing, respiratory function stable and nonlabored ventilation Cardiovascular status: blood pressure returned to baseline and stable Postop Assessment: no headache, no backache, no apparent nausea or vomiting and spinal receding Anesthetic complications: no    Last Vitals:  Vitals:   01/28/20 1730 01/28/20 1745  BP: (!) 87/56 (!) 89/55  Pulse: 88 85  Resp: 16 14  Temp:    SpO2: 95% 95%    Last Pain:  Vitals:   01/28/20 1730  TempSrc:   PainSc: 0-No pain                 Ameenah Prosser A.

## 2020-01-28 NOTE — Progress Notes (Signed)
Fetal heart rate 150 in Short Stay.  Reported To Dr Royce Macadamia and CRNA Gerald Stabs.  Ok to proceed to surgery.

## 2020-01-28 NOTE — Anesthesia Preprocedure Evaluation (Signed)
Anesthesia Evaluation  Patient identified by MRN, date of birth, ID band Patient awake    Reviewed: Allergy & Precautions, NPO status , Patient's Chart, lab work & pertinent test results  Airway Mallampati: II  TM Distance: >3 FB Neck ROM: Full    Dental no notable dental hx. (+) Teeth Intact   Pulmonary neg pulmonary ROS,    Pulmonary exam normal breath sounds clear to auscultation       Cardiovascular negative cardio ROS Normal cardiovascular exam Rhythm:Regular Rate:Normal     Neuro/Psych negative neurological ROS  negative psych ROS   GI/Hepatic PUD, IBS Ulcerative colitis   Endo/Other    Renal/GU Renal diseaseLeft ureteral calculus  negative genitourinary   Musculoskeletal negative musculoskeletal ROS (+)   Abdominal   Peds  Hematology  (+) anemia ,   Anesthesia Other Findings   Reproductive/Obstetrics (+) Pregnancy [redacted] weeks gestation                             Anesthesia Physical Anesthesia Plan  ASA: II and emergent  Anesthesia Plan: Spinal   Post-op Pain Management:    Induction:   PONV Risk Score and Plan: 4 or greater and Ondansetron, Dexamethasone and Treatment may vary due to age or medical condition  Airway Management Planned: Oral ETT, Natural Airway, Nasal Cannula and Simple Face Mask  Additional Equipment:   Intra-op Plan:   Post-operative Plan:   Informed Consent: I have reviewed the patients History and Physical, chart, labs and discussed the procedure including the risks, benefits and alternatives for the proposed anesthesia with the patient or authorized representative who has indicated his/her understanding and acceptance.     Dental advisory given  Plan Discussed with: CRNA and Surgeon  Anesthesia Plan Comments:         Anesthesia Quick Evaluation

## 2020-01-29 NOTE — Progress Notes (Signed)
Patient ID: Alice Sherman, female   DOB: 1986/06/13, 34 y.o.   MRN: [id number]  1 Day Post-Op Subjective: Pt's pain significantly improved since stent placement.  Objective: Vital signs in last 24 hours: Temp:  [97.2 F (36.2 C)-98.7 F (37.1 C)] 97.2 F (36.2 C) (03/03 1845) Pulse Rate:  [77-125] 96 (03/03 1845) Resp:  [14-20] 16 (03/03 1845) BP: (62-125)/(42-82) 110/78 (03/03 1845) SpO2:  [94 %-100 %] 94 % (03/03 1845) Weight:  [66.7 kg] 66.7 kg (03/03 1513)  Intake/Output from previous day: 03/03 0701 - 03/04 0700 In: 3812.5 [I.V.:3812.5] Out: 1800 [Urine:1800] Intake/Output this shift: Total I/O In: -  Out: 1400 [Urine:1400]  Physical Exam:  General: Alert and oriented Abdomen: Minimal CVAT  Lab Results: BMET Recent Labs    01/27/20 0203 01/28/20 0621  NA 136 137  K 3.8 3.6  CL 105 106  CO2 20* 24  GLUCOSE 109* 86  BUN 7 7  CREATININE 0.59 0.46  CALCIUM 8.8* 8.3*      Assessment/Plan: Left ureteral calculus: S/P left ureteral stent.  Pain improved.  We reviewed expected stent symptoms that may occur.  She still may need prn pain medication but should be easily controlled with oral medication.  [place] for discharge from urologic standpoint.  Patient to follow up with Dr. John Giovanni with [facility].  I have sent a message to Dr. Bernardo Heater to work on scheduling her for sometime shortly after her due date.   LOS: 2 days   Dutch Gray 01/29/2020, 6:34 AM

## 2020-01-29 NOTE — Plan of Care (Signed)
Pt to be discharged with printed instructions. NO concerns noted. Toya Smothers, RN

## 2020-01-29 NOTE — Progress Notes (Signed)
Feeling much better  Today's Vitals   01/28/20 1845 01/28/20 2016 01/29/20 0624 01/29/20 0810  BP: 110/78   97/63  Pulse: 96   96  Resp: 16   18  Temp: (!) 97.2 F (36.2 C)   97.7 F (36.5 C)  TempSrc:    Oral  SpO2: 94%   98%  Weight:      Height:      PainSc:  0-No pain Asleep    Body mass index is 27.79 kg/m.   Uterus soft, NT Back no CVAT  FHT cat one UCs irritability  A/P: 35 1/7 wks         Left nephrolithiasis             S/P Stent         Discharge home         Flomax 0.64m po qd         FU our office 5-7 days         FU with urologist PP

## 2020-01-29 NOTE — Discharge Summary (Signed)
Physician Discharge Summary  Patient ID: Alice Sherman MRN: [id number] DOB/AGE: [birth year] 34 y.o.  Admit date: 01/27/2020 Discharge date: 01/29/2020  Admission Diagnoses:left flank pain                34 6/7wks  Discharge Diagnoses:  Active Problems:   Acute left flank pain   Pregnancy 35 1/7 wks Left kidney stone  Discharged Condition: good  Hospital Course: admitted for pain management, IV fluids. Pain continued and urology consultation obtained. CT noted distal left kidney stone. DR Alinda Money took patient to OR for left ureteral stent placement. Patient now feels much better. FHT have been cat one.  Consults: urology  Significant Diagnostic Studies: labs:  Results for orders placed or performed during the hospital encounter of 01/27/20 (from the past 72 hour(s))  Culture, OB Urine     Status: Abnormal   Collection Time: 01/26/20  9:10 PM   Specimen: Urine, Random  Result Value Ref Range   Specimen Description URINE, RANDOM    Special Requests ADDED 0120 01/27/2020    Culture (A)     MULTIPLE SPECIES PRESENT, SUGGEST RECOLLECTION NO GROUP B STREP (S.AGALACTIAE) ISOLATED Performed at [facility] [address], [place], [place] [address]    Report Status 01/28/2020 FINAL   CBC with Differential/Platelet     Status: Abnormal   Collection Time: 01/27/20  2:03 AM  Result Value Ref Range   WBC 13.9 (H) 4.0 - 10.5 K/uL   RBC 3.35 (L) 3.87 - 5.11 MIL/uL   Hemoglobin 10.8 (L) 12.0 - 15.0 g/dL   HCT 32.5 (L) 36.0 - 46.0 %   MCV 97.0 80.0 - 100.0 fL   MCH 32.2 26.0 - 34.0 pg   MCHC 33.2 30.0 - 36.0 g/dL   RDW 14.8 11.5 - 15.5 %   Platelets 208 150 - 400 K/uL   nRBC 0.0 0.0 - 0.2 %   Neutrophils Relative % 85 %   Neutro Abs 11.9 (H) 1.7 - 7.7 K/uL   Lymphocytes Relative 9 %   Lymphs Abs 1.2 0.7 - 4.0 K/uL   Monocytes Relative 5 %   Monocytes Absolute 0.7 0.1 - 1.0 K/uL   Eosinophils Relative 0 %   Eosinophils Absolute 0.0 0.0 - 0.5 K/uL   Basophils Relative 0 %   Basophils Absolute 0.0 0.0 - 0.1 K/uL   Immature Granulocytes 1 %   Abs Immature Granulocytes 0.07 0.00 - 0.07 K/uL    Comment: Performed at [facility], [place] [address], [place], [place] [address]  Comprehensive metabolic panel     Status: Abnormal   Collection Time: 01/27/20  2:03 AM  Result Value Ref Range   Sodium 136 135 - 145 mmol/L   Potassium 3.8 3.5 - 5.1 mmol/L   Chloride 105 98 - 111 mmol/L   CO2 20 (L) 22 - 32 mmol/L   Glucose, Bld 109 (H) 70 - 99 mg/dL    Comment: Glucose reference range applies only to samples taken after fasting for at least 8 hours.   BUN 7 6 - 20 mg/dL   Creatinine, Ser 0.59 0.44 - 1.00 mg/dL   Calcium 8.8 (L) 8.9 - 10.3 mg/dL   Total Protein 6.0 (L) 6.5 - 8.1 g/dL   Albumin 2.8 (L) 3.5 - 5.0 g/dL   AST 19 15 - 41 U/L   ALT 12 0 - 44 U/L   Alkaline Phosphatase 122 38 - 126 U/L   Total Bilirubin 0.7 0.3 - 1.2  mg/dL   GFR calc non Af Amer >60 >60 mL/min   GFR calc Af Amer >60 >60 mL/min   Anion gap 11 5 - 15    Comment: Performed at [facility] [address], [place], [place] [address]  SARS Coronavirus 2 by RT PCR     Status: None   Collection Time: 01/27/20  2:45 AM  Result Value Ref Range   SARS Coronavirus 2 NEGATIVE NEGATIVE    Comment: (NOTE) Result indicates the ABSENCE of SARS-CoV-2 RNA in the patient specimen.  The lowest concentration of SARS-CoV-2 viral copies this assay can detect in nasopharyngeal swab specimens is 500 copies / mL.  A negative result does not preclude SARS-CoV-2 infection and should not be used as the sole basis for patient management decisions. A negative result may occur with improper specimen collection / handling, submission of a specimen other than nasopharyngeal swab, presence of viral mutation(s) within the areas targeted by this assay, and inadequate number of viral copies (<500 copies / mL) present.  Negative results must be combined with clinical observations, patient history, and  epidemiological information.  The expected result is NEGATIVE.  Patient Fact Sheet:  BlogSelections.co.uk   Provider Fact Sheet:  https://lucas.com/   This test is not yet approved or cleared by the [place] FDA and  has been authorized for  detection and/or diagnosis of SARS-CoV-2 by FDA under an Emergency Use Authorization (EUA).  This EUA will remain in effect (meaning this test can be used) for the duration of  the COVID-19 declaration under Section 564(b)(1) of the Act, 21 U.S.C. section 360bbb-3(b)(1), unless the authorization is terminated or revoked sooner Performed at [facility], [place] [address] [place], [place] [address]   Type and screen [place]     Status: None   Collection Time: 01/27/20  3:02 AM  Result Value Ref Range   ABO/RH(D) A POS    Antibody Screen NEG    Sample Expiration      01/30/2020,2359 Performed at [facility], [place] [address], [place], [place] [address]   ABO/Rh     Status: None   Collection Time: 01/27/20  3:02 AM  Result Value Ref Range   ABO/RH(D)      A POS Performed at [facility] [address] [place], [place] [address]   CBC     Status: Abnormal   Collection Time: 01/28/20  6:21 AM  Result Value Ref Range   WBC 7.2 4.0 - 10.5 K/uL   RBC 2.98 (L) 3.87 - 5.11 MIL/uL   Hemoglobin 9.4 (L) 12.0 - 15.0 g/dL   HCT 29.3 (L) 36.0 - 46.0 %   MCV 98.3 80.0 - 100.0 fL   MCH 31.5 26.0 - 34.0 pg   MCHC 32.1 30.0 - 36.0 g/dL   RDW 15.5 11.5 - 15.5 %   Platelets 179 150 - 400 K/uL   nRBC 0.0 0.0 - 0.2 %    Comment: Performed at [facility], [place] [address], [place], [place] [address]  Comprehensive metabolic panel     Status: Abnormal   Collection Time: 01/28/20  6:21 AM  Result Value Ref Range   Sodium 137 135 - 145 mmol/L   Potassium 3.6 3.5 - 5.1 mmol/L   Chloride 106 98 - 111 mmol/L   CO2 24 22 - 32 mmol/L   Glucose, Bld 86 70 - 99 mg/dL     Comment: Glucose reference  range applies only to samples taken after fasting for at least 8 hours.   BUN 7 6 - 20 mg/dL   Creatinine, Ser 0.46 0.44 - 1.00 mg/dL   Calcium 8.3 (L) 8.9 - 10.3 mg/dL   Total Protein 5.3 (L) 6.5 - 8.1 g/dL   Albumin 2.3 (L) 3.5 - 5.0 g/dL   AST 14 (L) 15 - 41 U/L   ALT 10 0 - 44 U/L   Alkaline Phosphatase 105 38 - 126 U/L   Total Bilirubin 0.5 0.3 - 1.2 mg/dL   GFR calc non Af Amer >60 >60 mL/min   GFR calc Af Amer >60 >60 mL/min   Anion gap 7 5 - 15    Comment: Performed at [facility] [address], [place], [place] [address]   and   Treatments: surgery: cystoscopy with left ureteral stent  Discharge Exam: Blood pressure 97/63, pulse 96, temperature 97.7 F (36.5 C), temperature source Oral, resp. rate 18, height 5' 0.98" (1.549 m), weight 66.7 kg, last menstrual period 04/28/2019, SpO2 98 %, currently breastfeeding. General appearance: alert, cooperative and no distress GI: soft, non-tender; bowel sounds normal; no masses,  no organomegaly  Disposition: Discharge disposition: 01-Home or Self Care      Will return to our office 5-7 days Will follow up with her urologist post partum She will take the prescribed nitrofurantoin BID for 5 days and continue Flomax 0.10m po qd  Allergies as of 01/29/2020      Reactions   Cefaclor Hives, Swelling, Other (See Comments)   Reaction:  Lip swelling  Reaction:  Lip swelling  Reaction:Lip swelling  Reaction:Lip swelling    Sulfasalazine Hives   Codeine Hives, Nausea And Vomiting, Swelling, Other (See Comments)   Reaction:  Lip swelling   Ibuprofen Hives, Swelling, Other (See Comments)   Reaction:  Swelling of lips and hands    Penicillins Hives, Swelling, Other (See Comments)   Reaction:  Swelling of lips and hands  Has patient had a PCN reaction causing immediate rash, facial/tongue/throat swelling, SOB or lightheadedness with hypotension: Yes Has patient had a PCN reaction causing severe  rash involving mucus membranes or skin necrosis: No Has patient had a PCN reaction that required hospitalization No Has patient had a PCN reaction occurring within the last 10 years: No If all of the above answers are "NO", then may proceed with Cephalosporin use.   Sulfa Antibiotics Hives      Medication List    STOP taking these medications   HYDROmorphone 2 MG tablet Commonly known as: Dilaudid     TAKE these medications   acetaminophen 500 MG tablet Commonly known as: TYLENOL Take 1,000 mg by mouth every 6 (six) hours as needed for mild pain, moderate pain or headache.   nitrofurantoin (macrocrystal-monohydrate) 100 MG capsule Commonly known as: MACROBID Take 1 capsule (100 mg total) by mouth 2 (two) times daily.   prenatal multivitamin Tabs tablet Take 1 tablet by mouth at bedtime.   tamsulosin 0.4 MG Caps capsule Commonly known as: Flomax Take 1 capsule (0.4 mg total) by mouth daily.        Signed: JShon MilletII 01/29/2020, 8:28 AM

## 2020-01-30 ENCOUNTER — Other Ambulatory Visit: Payer: Self-pay

## 2020-01-30 ENCOUNTER — Encounter (HOSPITAL_COMMUNITY): Payer: Self-pay | Admitting: Obstetrics and Gynecology

## 2020-01-30 ENCOUNTER — Inpatient Hospital Stay (HOSPITAL_COMMUNITY)
Admission: AD | Admit: 2020-01-30 | Discharge: 2020-01-30 | Disposition: A | Discharge status: Home / Self Care | Payer: PRIVATE HEALTH INSURANCE | Attending: Obstetrics and Gynecology | Admitting: Obstetrics and Gynecology

## 2020-01-30 DIAGNOSIS — Z880 Allergy status to penicillin: Secondary | ICD-10-CM | POA: Insufficient documentation

## 2020-01-30 DIAGNOSIS — Z885 Allergy status to narcotic agent status: Secondary | ICD-10-CM | POA: Diagnosis not present

## 2020-01-30 DIAGNOSIS — M549 Dorsalgia, unspecified: Secondary | ICD-10-CM | POA: Diagnosis present

## 2020-01-30 DIAGNOSIS — O26893 Other specified pregnancy related conditions, third trimester: Secondary | ICD-10-CM | POA: Insufficient documentation

## 2020-01-30 DIAGNOSIS — R109 Unspecified abdominal pain: Secondary | ICD-10-CM | POA: Diagnosis not present

## 2020-01-30 DIAGNOSIS — Z886 Allergy status to analgesic agent status: Secondary | ICD-10-CM | POA: Insufficient documentation

## 2020-01-30 DIAGNOSIS — Z3A35 35 weeks gestation of pregnancy: Secondary | ICD-10-CM | POA: Diagnosis not present

## 2020-01-30 DIAGNOSIS — M545 Low back pain: Secondary | ICD-10-CM | POA: Insufficient documentation

## 2020-01-30 LAB — COMPREHENSIVE METABOLIC PANEL
ALT: 18 U/L (ref 0–44)
AST: 29 U/L (ref 15–41)
Albumin: 2.9 g/dL — ABNORMAL LOW (ref 3.5–5.0)
Alkaline Phosphatase: 126 U/L (ref 38–126)
Anion gap: 11 (ref 5–15)
BUN: 6 mg/dL (ref 6–20)
CO2: 20 mmol/L — ABNORMAL LOW (ref 22–32)
Calcium: 9.4 mg/dL (ref 8.9–10.3)
Chloride: 110 mmol/L (ref 98–111)
Creatinine, Ser: 0.45 mg/dL (ref 0.44–1.00)
GFR calc Af Amer: >60 mL/min (ref >60)
GFR calc non Af Amer: >60 mL/min (ref >60)
Glucose, Bld: 80 mg/dL (ref 70–99)
Potassium: 3.7 mmol/L (ref 3.5–5.1)
Sodium: 141 mmol/L (ref 135–145)
Total Bilirubin: 0.6 mg/dL (ref 0.3–1.2)
Total Protein: 6.5 g/dL (ref 6.5–8.1)

## 2020-01-30 LAB — URINALYSIS, ROUTINE W REFLEX MICROSCOPIC
Bilirubin Urine: NEGATIVE
Glucose, UA: NEGATIVE mg/dL
Ketones, ur: NEGATIVE mg/dL
Leukocytes,Ua: NEGATIVE
Nitrite: NEGATIVE
Protein, ur: 30 mg/dL — AB
RBC / HPF: >50 RBC/hpf — ABNORMAL HIGH (ref 0–5)
Specific Gravity, Urine: 1.004 — ABNORMAL LOW (ref 1.005–1.030)
pH: 8 (ref 5.0–8.0)

## 2020-01-30 LAB — CBC
HCT: 35.6 % — ABNORMAL LOW (ref 36.0–46.0)
Hemoglobin: 11.6 g/dL — ABNORMAL LOW (ref 12.0–15.0)
MCH: 31.6 pg (ref 26.0–34.0)
MCHC: 32.6 g/dL (ref 30.0–36.0)
MCV: 97 fL (ref 80.0–100.0)
Platelets: 202 10*3/uL (ref 150–400)
RBC: 3.67 MIL/uL — ABNORMAL LOW (ref 3.87–5.11)
RDW: 14.8 % (ref 11.5–15.5)
WBC: 7.7 10*3/uL (ref 4.0–10.5)
nRBC: 0 % (ref 0.0–0.2)

## 2020-01-30 LAB — TYPE AND SCREEN
ABO/RH(D): A POS
Antibody Screen: NEGATIVE

## 2020-01-30 MED ORDER — PROMETHAZINE HCL 25 MG PO TABS: 25.0000 mg | ORAL_TABLET | Freq: Four times a day (QID) | ORAL | 0 refills | Status: DC | PRN

## 2020-01-30 MED ORDER — HYDROCODONE-ACETAMINOPHEN 5-325 MG PO TABS: 1.0000 | ORAL_TABLET | Freq: Four times a day (QID) | ORAL | 0 refills | Status: DC | PRN

## 2020-01-30 MED ORDER — LACTATED RINGERS IV BOLUS
1000.0000 mL | Freq: Once | INTRAVENOUS | Status: AC
  Administered 2020-01-30: 1000 mL via INTRAVENOUS

## 2020-01-30 NOTE — MAU Note (Signed)
To rm via wc.  Pt tearful,  Recent placement of stint in Left kidney.  Reports sudden onset of sharp pain at 1545, feels like it did.

## 2020-01-30 NOTE — MAU Provider Note (Addendum)
Patient Alice Sherman is a 34 y.o. G2P1001  at 62w2dhere with complaints of pain in her left back. She has a stent placed for left kidney stone measuring 3-4 mm on 01-28-2020.   She denies decreased fetal movements, vaginal bleeding, LOF. She denies headache, blurry vision, fever, SOB. She had an NSVD at term in 2018. She has had one other kidney stone in 2018. She is taking Flomax and Macrobid as prescribed.   History     CSN: [id number] Arrival date and time: 01/30/20 1704   First Provider Initiated Contact with Patient 01/30/20 1805      Chief Complaint  Patient presents with  . Flank Pain   Back Pain This is a new problem. The current episode started today. The pain is present in the lumbar spine. The quality of the pain is described as stabbing. The pain does not radiate. The pain is at a severity of 2/10. Pertinent negatives include no abdominal pain, chest pain, dysuria or fever.  It was much worse this afternoon (sudden onset at 136, but it is now a 2/10. She is afraid to go home and the pain gets worse. She is also worried about having spasms and vomiting.   OB History    Gravida  2   Para  1   Term  1   Preterm      AB      Living  1     SAB      TAB      Ectopic      Multiple  0   Live Births  1           Past Medical History:  Diagnosis Date  . Anemia    with pregnancy  . Breast mass   . Irritable bowel syndrome (IBS)    symptoms mostly diarrhea  . Kidney stones 10/2016   r kidney stone  . Ulcerative [facility] ([facility]     Past Surgical History:  Procedure Laterality Date  . CYSTOSCOPY WITH STENT PLACEMENT Left 01/28/2020   Procedure: CYSTOSCOPY URETHRAL WITH STENT PLACEMENT;  Surgeon: BRaynelle Bring MD;  [place] [facility]  Service: Urology;  Laterality: Left;  . CYSTOSCOPY WITH URETEROSCOPY AND STENT PLACEMENT Right 10/28/2016   Procedure: CYSTOSCOPY WITH RIGHT URETEROSCOPY;  Surgeon: JIrine Seal MD;  [facility];  Service: Urology;   Laterality: Right;  . CYSTOSCOPY/RETROGRADE/URETEROSCOPY/STONE EXTRACTION WITH BASKET Left 01/28/2020   Procedure: POSSIBLE URETHRAL STONE REMOVAL;  Surgeon: BRaynelle Bring MD;  [place] [facility]  Service: Urology;  Laterality: Left;    Family History  Problem Relation Age of Onset  . Hypertension Mother   . Kidney Stones Father   . Diverticulitis Father   . Leukemia Paternal Grandfather     Social History   Tobacco Use  . Smoking status: Never Smoker  . Smokeless tobacco: Never Used  Substance Use Topics  . Alcohol use: Not Currently    Comment: socially   . Drug use: No    Allergies:  Allergies  Allergen Reactions  . Cefaclor Hives, Swelling and Other (See Comments)    Reaction:  Lip swelling  Reaction:  Lip swelling  Reaction:Lip swelling   Reaction:Lip swelling    . Sulfasalazine Hives  . Codeine Hives, Nausea And Vomiting, Swelling and Other (See Comments)    Reaction:  Lip swelling  . Ibuprofen Hives, Swelling and Other (See Comments)    Reaction:  Swelling of lips and hands   . Penicillins Hives,  Swelling and Other (See Comments)    Reaction:  Swelling of lips and hands  Has patient had a PCN reaction causing immediate rash, facial/tongue/throat swelling, SOB or lightheadedness with hypotension: Yes Has patient had a PCN reaction causing severe rash involving mucus membranes or skin necrosis: No Has patient had a PCN reaction that required hospitalization No Has patient had a PCN reaction occurring within the last 10 years: No If all of the above answers are "NO", then may proceed with Cephalosporin use.  . Sulfa Antibiotics Hives    Medications Prior to Admission  Medication Sig Dispense Refill Last Dose  . acetaminophen (TYLENOL) 500 MG tablet Take 1,000 mg by mouth every 6 (six) hours as needed for mild pain, moderate pain or headache.   Past Month at Unknown time  . nitrofurantoin, macrocrystal-monohydrate, (MACROBID) 100 MG capsule Take 1 capsule (100  mg total) by mouth 2 (two) times daily. 14 capsule 0 01/30/2020 at Unknown time  . Prenatal Vit-Fe Fumarate-FA (PRENATAL MULTIVITAMIN) TABS tablet Take 1 tablet by mouth at bedtime.   01/29/2020 at Unknown time  . tamsulosin (FLOMAX) 0.4 MG CAPS capsule Take 1 capsule (0.4 mg total) by mouth daily. 3 capsule 1 01/30/2020 at Unknown time  . UNABLE TO FIND Inject into the vein every 8 (eight) weeks. Entiviyo       Review of Systems  Constitutional: Negative for fever.  Cardiovascular: Negative for chest pain.  Gastrointestinal: Negative for abdominal pain.  Genitourinary: Negative for dysuria.  Musculoskeletal: Positive for back pain.  Neurological: Negative.   Psychiatric/Behavioral: Negative.    Physical Exam   Blood pressure 126/80, pulse 100, temperature 98.5 F (36.9 C), temperature source Oral, resp. rate 20, last menstrual period 04/28/2019, SpO2 100 %, currently breastfeeding.  Physical Exam  Constitutional: She is oriented to person, place, and time. She appears well-developed.  HENT:  Head: Normocephalic.  Respiratory: Effort normal.  GI: Soft.  Musculoskeletal:        General: Normal range of motion.     Cervical back: Normal range of motion.  Neurological: She is alert and oriented to person, place, and time.  Skin: Skin is warm and dry.    MAU Course  Procedures  MDM -NST: 150 bpm, mod var, present acel, neg decels, no contractions.   -consult with urology; who recommends avoiding X-ray and try to manage with IV LR and pain medicine over re-imaging. If patient comes back with signs of infection, will do 1 View Xray of Kidney and urinary bladder to check stent placement. If continuing pain, admit for observation and pain management.  -patient received 1L of IV fluids; no pain or vomiting while in MAU. VSS; she desires discharge.  Assessment and Plan   1. Acute left flank pain   -Rest on the side that doesn't hurt and hydrate  -Return if intractable pain, fever, all  over body aches and chills -Dr. Corinna Capra aware of patient's plan and recommends vicoden . -send home with phenergan and vicoden 5-325 q 4 hours (30) -Keep follow up appt on Monday (two days) with Dr. Corinna Capra.  Mervyn Skeeters Munachimso Palin 01/30/2020, 6:18 PM

## 2020-01-31 LAB — RPR: RPR Ser Ql: NONREACTIVE

## 2020-02-02 LAB — OB RESULTS CONSOLE GBS: GBS: NEGATIVE

## 2020-02-20 ENCOUNTER — Encounter (HOSPITAL_COMMUNITY): Payer: Self-pay | Admitting: *Deleted

## 2020-02-20 ENCOUNTER — Telehealth (HOSPITAL_COMMUNITY): Payer: Self-pay | Admitting: *Deleted

## 2020-02-20 NOTE — Telephone Encounter (Signed)
Preadmission screen  

## 2020-02-28 ENCOUNTER — Other Ambulatory Visit (HOSPITAL_COMMUNITY)
Admission: RE | Admit: 2020-02-28 | Discharge: 2020-02-28 | Disposition: A | Discharge status: Home / Self Care | Payer: PRIVATE HEALTH INSURANCE | Source: Ambulatory Visit | Attending: Obstetrics and Gynecology | Admitting: Obstetrics and Gynecology

## 2020-02-28 LAB — SARS CORONAVIRUS 2 (TAT 6-24 HRS): SARS Coronavirus 2: NEGATIVE

## 2020-03-01 ENCOUNTER — Other Ambulatory Visit: Payer: Self-pay | Admitting: Obstetrics and Gynecology

## 2020-03-01 NOTE — H&P (Signed)
Alice Sherman is a 34 y.o. female presenting for two stage IOL at term. Pregnancy complicated by ulcerative colitis on Entyvio infusions followed at [facility]. Also kidney stone with ureteral stent placed this pregnancy and to be removed PP. OB History    Gravida  2   Para  1   Term  1   Preterm      AB      Living  1     SAB      TAB      Ectopic      Multiple  0   Live Births  1          Past Medical History:  Diagnosis Date  . Anemia    with pregnancy  . Breast mass   . Irritable bowel syndrome (IBS)    symptoms mostly diarrhea  . Kidney stones 10/2016   r kidney stone  . Ulcerative colitis [facility])    Past Surgical History:  Procedure Laterality Date  . CYSTOSCOPY WITH STENT PLACEMENT Left 01/28/2020   Procedure: CYSTOSCOPY URETHRAL WITH STENT PLACEMENT;  Surgeon: Raynelle Bring, MD;  Location: [place];  Service: Urology;  Laterality: Left;  . CYSTOSCOPY WITH URETEROSCOPY AND STENT PLACEMENT Right 10/28/2016   Procedure: CYSTOSCOPY WITH RIGHT URETEROSCOPY;  Surgeon: Irine Seal, MD;  [facility];  Service: Urology;  Laterality: Right;  . CYSTOSCOPY/RETROGRADE/URETEROSCOPY/STONE EXTRACTION WITH BASKET Left 01/28/2020   Procedure: POSSIBLE URETHRAL STONE REMOVAL;  Surgeon: Raynelle Bring, MD;  Location: [place];  Service: Urology;  Laterality: Left;   Family History: family history includes Diverticulitis in her father; Hypertension in her mother; Kidney Stones in her father; Leukemia in her paternal grandfather. Social History:  reports that she has never smoked. She has never used smokeless tobacco. She reports previous alcohol use. She reports that she does not use drugs.     Maternal Diabetes: No Genetic Screening: Normal Maternal Ultrasounds/Referrals: Normal Fetal Ultrasounds or other Referrals:  None Maternal Substance Abuse:  No Significant Maternal Medications:  None Significant Maternal Lab Results:  Group B Strep negative Other Comments:  None  Review of  Systems  Eyes: Negative for visual disturbance.  Gastrointestinal: Negative for abdominal pain.  Neurological: Negative for headaches.   Maternal Medical History:  Fetal activity: Perceived fetal activity is normal.        Last menstrual period 04/28/2019, currently breastfeeding. Maternal Exam:  Abdomen: Fetal presentation: vertex     Physical Exam  Cardiovascular: Normal rate.  Respiratory: Effort normal.  GI: Soft.     Prenatal labs: ABO, Rh: --/--/A POS (03/05 1900) Antibody: NEG (03/05 1900) Rubella: Immune (08/31 0000) RPR: NON REACTIVE (03/05 1910)  HBsAg: Negative (08/31 0000)  HIV: Non-reactive (08/31 0000)  GBS: Negative/-- (03/08 0000)   Assessment/Plan: 34 yo G2P1 at term for two stage IOL Plan on Cytotec   Shon Millet II 03/01/2020, 6:01 PM

## 2020-03-02 ENCOUNTER — Inpatient Hospital Stay (HOSPITAL_COMMUNITY): Payer: PRIVATE HEALTH INSURANCE | Admitting: Anesthesiology

## 2020-03-02 ENCOUNTER — Encounter (HOSPITAL_COMMUNITY): Payer: Self-pay | Admitting: Obstetrics and Gynecology

## 2020-03-02 ENCOUNTER — Inpatient Hospital Stay (HOSPITAL_COMMUNITY): Payer: PRIVATE HEALTH INSURANCE

## 2020-03-02 ENCOUNTER — Other Ambulatory Visit: Payer: Self-pay

## 2020-03-02 ENCOUNTER — Inpatient Hospital Stay (HOSPITAL_COMMUNITY)
Admission: AD | Admit: 2020-03-02 | Discharge: 2020-03-04 | DRG: 987 | Disposition: A | Discharge status: Home / Self Care | Payer: PRIVATE HEALTH INSURANCE | Attending: Obstetrics and Gynecology | Admitting: Obstetrics and Gynecology

## 2020-03-02 DIAGNOSIS — Z87442 Personal history of urinary calculi: Secondary | ICD-10-CM | POA: Diagnosis not present

## 2020-03-02 DIAGNOSIS — Z3A39 39 weeks gestation of pregnancy: Secondary | ICD-10-CM

## 2020-03-02 DIAGNOSIS — O411230 Chorioamnionitis, third trimester, not applicable or unspecified: Secondary | ICD-10-CM | POA: Diagnosis present

## 2020-03-02 DIAGNOSIS — N898 Other specified noninflammatory disorders of vagina: Secondary | ICD-10-CM | POA: Diagnosis present

## 2020-03-02 DIAGNOSIS — Z880 Allergy status to penicillin: Secondary | ICD-10-CM | POA: Diagnosis not present

## 2020-03-02 DIAGNOSIS — Z20822 Contact with and (suspected) exposure to covid-19: Secondary | ICD-10-CM | POA: Diagnosis present

## 2020-03-02 DIAGNOSIS — O26893 Other specified pregnancy related conditions, third trimester: Secondary | ICD-10-CM | POA: Diagnosis present

## 2020-03-02 DIAGNOSIS — O9962 Diseases of the digestive system complicating childbirth: Secondary | ICD-10-CM | POA: Diagnosis present

## 2020-03-02 DIAGNOSIS — K5190 Ulcerative colitis, unspecified, without complications: Secondary | ICD-10-CM | POA: Diagnosis present

## 2020-03-02 DIAGNOSIS — Z3490 Encounter for supervision of normal pregnancy, unspecified, unspecified trimester: Secondary | ICD-10-CM

## 2020-03-02 LAB — CBC
HCT: 34.4 % — ABNORMAL LOW (ref 36.0–46.0)
Hemoglobin: 11.4 g/dL — ABNORMAL LOW (ref 12.0–15.0)
MCH: 31.3 pg (ref 26.0–34.0)
MCHC: 33.1 g/dL (ref 30.0–36.0)
MCV: 94.5 fL (ref 80.0–100.0)
Platelets: 188 10*3/uL (ref 150–400)
RBC: 3.64 MIL/uL — ABNORMAL LOW (ref 3.87–5.11)
RDW: 13.8 % (ref 11.5–15.5)
WBC: 6.8 10*3/uL (ref 4.0–10.5)
nRBC: 0 % (ref 0.0–0.2)

## 2020-03-02 LAB — RPR: RPR Ser Ql: NONREACTIVE

## 2020-03-02 LAB — TYPE AND SCREEN
ABO/RH(D): A POS
Antibody Screen: NEGATIVE

## 2020-03-02 MED ORDER — LACTATED RINGERS IV SOLN
500.0000 mL | INTRAVENOUS | Status: DC | PRN
  Administered 2020-03-02: 1000 mL via INTRAVENOUS

## 2020-03-02 MED ORDER — TERBUTALINE SULFATE 1 MG/ML IJ SOLN: 0.2500 mg | Freq: Once | INTRAMUSCULAR | Status: DC | PRN

## 2020-03-02 MED ORDER — EPHEDRINE 5 MG/ML INJ: 10.0000 mg | INTRAVENOUS | Status: DC | PRN

## 2020-03-02 MED ORDER — CLINDAMYCIN PHOSPHATE 900 MG/50ML IV SOLN
900.0000 mg | Freq: Three times a day (TID) | INTRAVENOUS | Status: DC
  Administered 2020-03-02 – 2020-03-04 (×5): 900 mg via INTRAVENOUS
  Filled 2020-03-02 (×7): qty 50

## 2020-03-02 MED ORDER — ACETAMINOPHEN 325 MG PO TABS
650.0000 mg | ORAL_TABLET | ORAL | Status: DC | PRN
  Administered 2020-03-03 – 2020-03-04 (×6): 650 mg via ORAL
  Filled 2020-03-02 (×6): qty 2

## 2020-03-02 MED ORDER — OXYTOCIN BOLUS FROM INFUSION
500.0000 mL | Freq: Once | INTRAVENOUS | Status: AC
  Administered 2020-03-02: 500 mL via INTRAVENOUS

## 2020-03-02 MED ORDER — ONDANSETRON HCL 4 MG PO TABS: 4.0000 mg | ORAL_TABLET | ORAL | Status: DC | PRN

## 2020-03-02 MED ORDER — DIPHENHYDRAMINE HCL 25 MG PO CAPS: 25.0000 mg | ORAL_CAPSULE | Freq: Four times a day (QID) | ORAL | Status: DC | PRN

## 2020-03-02 MED ORDER — PHENYLEPHRINE 40 MCG/ML (10ML) SYRINGE FOR IV PUSH (FOR BLOOD PRESSURE SUPPORT): 80.0000 ug | PREFILLED_SYRINGE | INTRAVENOUS | Status: DC | PRN

## 2020-03-02 MED ORDER — COCONUT OIL OIL: 1.0000 "application " | TOPICAL_OIL | Status: DC | PRN

## 2020-03-02 MED ORDER — DIPHENHYDRAMINE HCL 50 MG/ML IJ SOLN: 12.5000 mg | INTRAMUSCULAR | Status: DC | PRN

## 2020-03-02 MED ORDER — TRAMADOL HCL 50 MG PO TABS: 50.0000 mg | ORAL_TABLET | Freq: Four times a day (QID) | ORAL | Status: DC | PRN

## 2020-03-02 MED ORDER — ACETAMINOPHEN 325 MG PO TABS
650.0000 mg | ORAL_TABLET | ORAL | Status: DC | PRN
  Administered 2020-03-02: 650 mg via ORAL
  Filled 2020-03-02: qty 2

## 2020-03-02 MED ORDER — MISOPROSTOL 25 MCG QUARTER TABLET
25.0000 ug | ORAL_TABLET | ORAL | Status: DC | PRN
  Administered 2020-03-02: 25 ug via VAGINAL
  Filled 2020-03-02 (×2): qty 1

## 2020-03-02 MED ORDER — FLEET ENEMA 7-19 GM/118ML RE ENEM: 1.0000 | ENEMA | RECTAL | Status: DC | PRN

## 2020-03-02 MED ORDER — WITCH HAZEL-GLYCERIN EX PADS: 1.0000 "application " | MEDICATED_PAD | CUTANEOUS | Status: DC | PRN

## 2020-03-02 MED ORDER — OXYTOCIN 40 UNITS IN NORMAL SALINE INFUSION - SIMPLE MED: 2.5000 [IU]/h | INTRAVENOUS | Status: DC

## 2020-03-02 MED ORDER — LACTATED RINGERS IV SOLN: INTRAVENOUS | Status: DC

## 2020-03-02 MED ORDER — SOD CITRATE-CITRIC ACID 500-334 MG/5ML PO SOLN: 30.0000 mL | ORAL | Status: DC | PRN

## 2020-03-02 MED ORDER — ZOLPIDEM TARTRATE 5 MG PO TABS: 5.0000 mg | ORAL_TABLET | Freq: Every evening | ORAL | Status: DC | PRN

## 2020-03-02 MED ORDER — TETANUS-DIPHTH-ACELL PERTUSSIS 5-2.5-18.5 LF-MCG/0.5 IM SUSP: 0.5000 mL | Freq: Once | INTRAMUSCULAR | Status: DC

## 2020-03-02 MED ORDER — ONDANSETRON HCL 4 MG/2ML IJ SOLN: 4.0000 mg | INTRAMUSCULAR | Status: DC | PRN

## 2020-03-02 MED ORDER — DIBUCAINE (PERIANAL) 1 % EX OINT: 1.0000 "application " | TOPICAL_OINTMENT | CUTANEOUS | Status: DC | PRN

## 2020-03-02 MED ORDER — FENTANYL-BUPIVACAINE-NACL 0.5-0.125-0.9 MG/250ML-% EP SOLN
12.0000 mL/h | EPIDURAL | Status: DC | PRN
  Filled 2020-03-02: qty 250

## 2020-03-02 MED ORDER — OXYTOCIN 40 UNITS IN NORMAL SALINE INFUSION - SIMPLE MED
1.0000 m[IU]/min | INTRAVENOUS | Status: DC
  Administered 2020-03-02: 2 m[IU]/min via INTRAVENOUS
  Filled 2020-03-02: qty 1000

## 2020-03-02 MED ORDER — LIDOCAINE HCL (PF) 1 % IJ SOLN
30.0000 mL | INTRAMUSCULAR | Status: AC | PRN
  Administered 2020-03-02: 30 mL via SUBCUTANEOUS
  Filled 2020-03-02: qty 30

## 2020-03-02 MED ORDER — SODIUM CHLORIDE (PF) 0.9 % IJ SOLN
INTRAMUSCULAR | Status: DC | PRN
  Administered 2020-03-02: 12 mL/h via EPIDURAL

## 2020-03-02 MED ORDER — SIMETHICONE 80 MG PO CHEW: 80.0000 mg | CHEWABLE_TABLET | ORAL | Status: DC | PRN

## 2020-03-02 MED ORDER — GENTAMICIN SULFATE 40 MG/ML IJ SOLN
5.0000 mg/kg | INTRAVENOUS | Status: DC
  Administered 2020-03-02: 280 mg via INTRAVENOUS
  Filled 2020-03-02 (×2): qty 7

## 2020-03-02 MED ORDER — HYDROXYZINE HCL 50 MG PO TABS: 50.0000 mg | ORAL_TABLET | Freq: Four times a day (QID) | ORAL | Status: DC | PRN

## 2020-03-02 MED ORDER — BENZOCAINE-MENTHOL 20-0.5 % EX AERO
1.0000 "application " | INHALATION_SPRAY | CUTANEOUS | Status: DC | PRN
  Administered 2020-03-03: 1 via TOPICAL
  Filled 2020-03-02 (×2): qty 56

## 2020-03-02 MED ORDER — ONDANSETRON HCL 4 MG/2ML IJ SOLN
4.0000 mg | Freq: Four times a day (QID) | INTRAMUSCULAR | Status: DC | PRN
  Administered 2020-03-02: 4 mg via INTRAVENOUS
  Filled 2020-03-02: qty 2

## 2020-03-02 MED ORDER — SENNOSIDES-DOCUSATE SODIUM 8.6-50 MG PO TABS
2.0000 | ORAL_TABLET | ORAL | Status: DC
  Administered 2020-03-03 (×2): 2 via ORAL
  Filled 2020-03-02 (×2): qty 2

## 2020-03-02 MED ORDER — PRENATAL MULTIVITAMIN CH
1.0000 | ORAL_TABLET | Freq: Every day | ORAL | Status: DC
  Administered 2020-03-03 – 2020-03-04 (×2): 1 via ORAL
  Filled 2020-03-02 (×2): qty 1

## 2020-03-02 MED ORDER — FENTANYL CITRATE (PF) 100 MCG/2ML IJ SOLN
50.0000 ug | INTRAMUSCULAR | Status: DC | PRN
  Administered 2020-03-02 (×3): 100 ug via INTRAVENOUS
  Filled 2020-03-02 (×3): qty 2

## 2020-03-02 MED ORDER — LACTATED RINGERS IV SOLN
500.0000 mL | Freq: Once | INTRAVENOUS | Status: AC
  Administered 2020-03-02: 500 mL via INTRAVENOUS

## 2020-03-02 NOTE — Anesthesia Procedure Notes (Signed)
Epidural Patient location during procedure: OB Start time: 03/02/2020 10:25 AM End time: 03/02/2020 10:30 AM  Staffing Anesthesiologist: Janeece Riggers, MD  Preanesthetic Checklist Completed: patient identified, IV checked, site marked, risks and benefits discussed, surgical consent, monitors and equipment checked, pre-op evaluation and timeout performed  Epidural Patient position: sitting Prep: DuraPrep and site prepped and draped Patient monitoring: continuous pulse ox and blood pressure Approach: midline Location: L3-L4 Injection technique: LOR air  Needle:  Needle type: Tuohy  Needle gauge: 17 G Needle length: 9 cm and 9 Needle insertion depth: 5 cm cm Catheter type: closed end flexible Catheter size: 19 Gauge Catheter at skin depth: 10 cm Test dose: negative  Assessment Events: blood not aspirated, injection not painful, no injection resistance, no paresthesia and negative IV test

## 2020-03-02 NOTE — Progress Notes (Signed)
No change to H&P per patient history FHT cat one UCs q2-3 after Cytotec x 1 Cx 3/60/-3/vtx AROM copious clear fluid D/W patient epidural prn and if no cervical change in 1 hour will begin pitocin

## 2020-03-02 NOTE — Progress Notes (Signed)
Pharmacy Antibiotic Note  Alice Sherman is a 34 y.o. female admitted on 03/02/2020 for IOL at [redacted]w[redacted]d Pharmacy has been consulted for Gentamicin dosing for chorioamnionitis/ Triple I due to maternal temp during labor and delivery.  Plan: Gentamicin 28671m(71m62mg) IV q24h Will continue to follow and assess need for further workup  Height: 5' 1"  (154.9 cm) Weight: 68.9 kg (152 lb) IBW/kg (Calculated) : 47.8  Dosing weight: 56.2kg  Temp (24hrs), Avg:98.9 F (37.2 C), Min:98 F (36.7 C), Max:100.9 F (38.3 C)  Recent Labs  Lab 03/02/20 0025  WBC 6.8    CrCl cannot be calculated (Patient's most recent lab result is older than the maximum 21 days allowed.).    Allergies  Allergen Reactions  . Cefaclor Hives, Swelling and Other (See Comments)    Reaction:  Lip swelling  Reaction:  Lip swelling  Reaction:Lip swelling   Reaction:Lip swelling    . Sulfasalazine Hives  . Codeine Hives, Nausea And Vomiting, Swelling and Other (See Comments)    Reaction:  Lip swelling  . Ibuprofen Hives, Swelling and Other (See Comments)    Reaction:  Swelling of lips and hands   . Penicillins Hives, Swelling and Other (See Comments)    Reaction:  Swelling of lips and hands  Has patient had a PCN reaction causing immediate rash, facial/tongue/throat swelling, SOB or lightheadedness with hypotension: Yes Has patient had a PCN reaction causing severe rash involving mucus membranes or skin necrosis: No Has patient had a PCN reaction that required hospitalization No Has patient had a PCN reaction occurring within the last 10 years: No If all of the above answers are "NO", then may proceed with Cephalosporin use.  . Sulfa Antibiotics Hives    Antimicrobials this admission: Clindamycin 900m38m q8h  4/6 >>    Thank you for allowing pharmacy to be a part of this patient's care.  AngeVernie Ammons/2021 6:29 PM

## 2020-03-02 NOTE — Progress Notes (Signed)
Delivery Note At 5:51 PM a viable female was delivered via Vaginal, Spontaneous (Presentation: Left Occiput Anterior).  APGAR: 8, 9; weight  .   Placenta status: Spontaneous, Intact to path.  Cord: 3 vessels with the following complications: None.  Cord pH: pendingT=100.9 in second stage of labor. PCN allergy. She is given Tylenol 644m po and clindamycin 9057mIV just prior to delivery. After delivery T=100.9  Anesthesia: Epidural Episiotomy: second degree MLE repaired. 4 cm posterior ML vaginal cyst exposed by MLE>resected and sent to pathology. Repair of defect [facility]. Lacerations:  Suture Repair: 2.0 vicryl rapide Est. Blood Loss (mL):  558 ml  Mom to postpartum.  Baby to Couplet care / Skin to Skin.  Will continue Gent/clinda post partum  JaShon MilletI 03/02/2020, 6:19 PM

## 2020-03-02 NOTE — Anesthesia Preprocedure Evaluation (Signed)
Anesthesia Evaluation  Patient identified by MRN, date of birth, ID band Patient awake    Reviewed: Allergy & Precautions, H&P , NPO status , Patient's Chart, lab work & pertinent test results, reviewed documented beta blocker date and time   Airway Mallampati: II  TM Distance: >3 FB Neck ROM: full    Dental no notable dental hx.    Pulmonary neg pulmonary ROS,    Pulmonary exam normal breath sounds clear to auscultation       Cardiovascular negative cardio ROS Normal cardiovascular exam Rhythm:regular Rate:Normal     Neuro/Psych negative neurological ROS  negative psych ROS   GI/Hepatic negative GI ROS, Neg liver ROS,   Endo/Other  negative endocrine ROS  Renal/GU negative Renal ROS  negative genitourinary   Musculoskeletal   Abdominal   Peds  Hematology negative hematology ROS (+)   Anesthesia Other Findings   Reproductive/Obstetrics (+) Pregnancy                             Anesthesia Physical Anesthesia Plan  ASA: II  Anesthesia Plan: Epidural   Post-op Pain Management:    Induction:   PONV Risk Score and Plan:   Airway Management Planned:   Additional Equipment:   Intra-op Plan:   Post-operative Plan:   Informed Consent: I have reviewed the patients History and Physical, chart, labs and discussed the procedure including the risks, benefits and alternatives for the proposed anesthesia with the patient or authorized representative who has indicated his/her understanding and acceptance.       Plan Discussed with: Anesthesiologist  Anesthesia Plan Comments:         Anesthesia Quick Evaluation

## 2020-03-03 LAB — CBC
HCT: 32.6 % — ABNORMAL LOW (ref 36.0–46.0)
Hemoglobin: 10.8 g/dL — ABNORMAL LOW (ref 12.0–15.0)
MCH: 31.4 pg (ref 26.0–34.0)
MCHC: 33.1 g/dL (ref 30.0–36.0)
MCV: 94.8 fL (ref 80.0–100.0)
Platelets: 173 10*3/uL (ref 150–400)
RBC: 3.44 MIL/uL — ABNORMAL LOW (ref 3.87–5.11)
RDW: 14.3 % (ref 11.5–15.5)
WBC: 13.1 10*3/uL — ABNORMAL HIGH (ref 4.0–10.5)
nRBC: 0 % (ref 0.0–0.2)

## 2020-03-03 NOTE — Anesthesia Postprocedure Evaluation (Signed)
Anesthesia Post Note  Patient: Alice Sherman  Procedure(s) Performed: AN AD HOC LABOR EPIDURAL     Patient location during evaluation: Mother Baby Anesthesia Type: Epidural Level of consciousness: awake and alert Pain management: pain level controlled Vital Signs Assessment: post-procedure vital signs reviewed and stable Respiratory status: spontaneous breathing, nonlabored ventilation and respiratory function stable Cardiovascular status: stable Postop Assessment: no headache, no backache and epidural receding Anesthetic complications: no    Last Vitals:  Vitals:   03/03/20 0125 03/03/20 0533  BP: 102/80 104/74  Pulse: 84 90  Resp: 16 16  Temp: 36.8 C 36.9 C  SpO2:      Last Pain:  Vitals:   03/03/20 0759  TempSrc:   PainSc: 1                  Jaquavion Mccannon

## 2020-03-03 NOTE — Progress Notes (Signed)
Dr. Helane Rima notified of patients 1012 dose of antibiotic not going in all the way. She stated it was ok not to add another additional dose. Ok to keep with scheduled doses. Dr. Helane Rima was updated on patient's status. Pharmacy aware.

## 2020-03-03 NOTE — Lactation Note (Signed)
This note was copied from a baby's chart. Lactation Consultation Note  Patient Name: Girl Brinnley Lacap RAXEN'M Date: 03/03/2020  P2, 96 hour term female infant. Per parents, infant had 4 stools and 4 voids since birth. Per mom, infant is breastfeed 30 minutes most feeding and few times went past 3 hours to breastfed. LC suggested to  mom to breastfeed infant more frequently according to hunger cues, 8 -12 times with 24 hours and try not exceed 3 hours without breastfeeding infant. LC in room, when  infant was crying, per mom infant last breastfed at 8 pm for 30 minutes, LC mention to parents that at 2nd day of life infant with cluster feed and the importance of feeding infant on demand. Per mom, she is having little nipple soreness was given coconut oil and comfort gels by RN, mom did not want LC assistance with latching infant at breast. LC unable to assess mom's breast tissue or infant latch at this time.  Mom know to call RN or LC if she has questions, concerns or desire assistance with latching infant at breast.    Maternal Data    Feeding    LATCH Score                   Interventions    Lactation Tools Discussed/Used     Consult Status      Vicente Serene 03/03/2020, 9:21 PM

## 2020-03-03 NOTE — Progress Notes (Signed)
PPD # 1   Patient doing very well.  BP 104/74 (BP Location: Right Arm)   Pulse 90   Temp 98.4 F (36.9 C) (Oral)   Resp 16   Ht 5' 1"  (1.549 m)   Wt 68.9 kg   LMP 04/28/2019 (Approximate)   SpO2 98%   Breastfeeding Unknown   BMI 28.72 kg/m  Results for orders placed or performed during the hospital encounter of 03/02/20 (from the past 24 hour(s))  CBC     Status: Abnormal   Collection Time: 03/03/20  6:50 AM  Result Value Ref Range   WBC 13.1 (H) 4.0 - 10.5 K/uL   RBC 3.44 (L) 3.87 - 5.11 MIL/uL   Hemoglobin 10.8 (L) 12.0 - 15.0 g/dL   HCT 32.6 (L) 36.0 - 46.0 %   MCV 94.8 80.0 - 100.0 fL   MCH 31.4 26.0 - 34.0 pg   MCHC 33.1 30.0 - 36.0 g/dL   RDW 14.3 11.5 - 15.5 %   Platelets 173 150 - 400 K/uL   nRBC 0.0 0.0 - 0.2 %   Abdomen is soft and non tender  PPD # 1  chroioamnionitis  Has ureteral stent in place  Doing well Continue antibiotics today Patient to be discharged home tomorrow and already has appointment with urology to arrange stent/stone removal

## 2020-03-03 NOTE — Lactation Note (Signed)
This note was copied from a baby's chart. Lactation Consultation Note  Patient Name: Alice Sherman [id number]'T Date: 03/03/2020 Reason for consult: Initial assessment;Term P2, 8 hour term female infant. Infant had 2 voids since birth. Per mom, she feels breastfeeding is going well, when infant latches she is only feeling a tug no pain with latch. LC did not observe latch, per mom, she finished breastfeeding infant for 15 minutes prior to [facility] entering the room.  Mom is experienced at breastfeeding she breastfed her 34 year old for 3 months. Per mom, she does have DEBP at home. Mom knows to breastfed infant according hunger cues, 8 to 12 times within 24 hours and not exceed 3 hours without breastfeeding infant. Parents will continue to do STS as much as possible. Reviewed Baby & Me book's Breastfeeding Basics.  Mom knows to call RN or LC if she has any questions, concerns or need assistance with latching infant at breast.  Mom made aware of O/P services, breastfeeding support groups, community resources, and our phone # for post-discharge questions.    Maternal Data Formula Feeding for Exclusion: Yes Reason for exclusion: Mother's choice to formula and breast feed on admission Has patient been taught Hand Expression?: Yes Does the patient have breastfeeding experience prior to this delivery?: Yes  Feeding Feeding Type: Breast Fed  LATCH Score                   Interventions Interventions: Breast feeding basics reviewed;Skin to skin;Hand express  Lactation Tools Discussed/Used WIC Program: No   Consult Status Consult Status: Follow-up Date: 03/03/20 Follow-up type: In-patient    Vicente Serene 03/03/2020, 2:32 AM

## 2020-03-04 LAB — SURGICAL PATHOLOGY

## 2020-03-04 NOTE — Discharge Summary (Signed)
Obstetric Discharge Summary Reason for Admission: induction of labor Prenatal Procedures: none Intrapartum Procedures: spontaneous vaginal delivery, chorioamnionitis Postpartum Procedures: antibiotics Complications-Operative and Postpartum: 2nd degree perineal laceration, old inclusion cyst repaired and resected Hemoglobin  Date Value Ref Range Status  03/03/2020 10.8 (L) 12.0 - 15.0 g/dL Final   HCT  Date Value Ref Range Status  03/03/2020 32.6 (L) 36.0 - 46.0 % Final    Physical Exam:  General: alert Lochia: appropriate Uterine Fundus: firm Incision: healing well DVT Evaluation: No evidence of DVT seen on physical exam.  Discharge Diagnoses: Term Pregnancy-delivered and Amnionitis  Discharge Information: Date: 03/04/2020 Activity: pelvic rest Diet: routine Medications: PNV Condition: stable Instructions: refer to practice specific booklet Discharge to: home F/u with urology for stent removal per their instructions.   Newborn Data: Live born female  Birth Weight: 6 lb 14.6 oz (3135 g) APGAR: 8, 9  Newborn Delivery   Birth date/time: 03/02/2020 17:51:00 Delivery type: Vaginal, Spontaneous      Home with mother.  Tyson Dense 03/04/2020, 8:39 AM

## 2020-03-04 NOTE — Lactation Note (Signed)
This note was copied from a baby's chart. Lactation Consultation Note  Patient Name: Alice Sherman [id number] Date: 03/04/2020 Reason for consult: Follow-up assessment  P2 mother whose infant is now 64 hours old.  This is a term baby at 39+6 weeks.  Baby was asleep in mother's arms when I arrived.  Mother had no questions/concerns related to breast feeding.  She has been feeding 8-12 times/24 hours and on cue.  Baby did some cluster feeding last night.  LATCH scores 8-9, adequate voids/stools and mother feels much more comfortable breast feeding this baby as compared to her first child.  Engorgement prevention/treatment reviewed.  Mother has experienced engorgement in the past.  Manual pump provided and mother has a DEBP for home use.  She has our OP phone number for questions after discharge.  Father present.  Encouraged her to call me for any further questions prior to discharge.  Mother has a pediatrician's visit tomorrow at 0900.   Maternal Data    Feeding    LATCH Score                   Interventions    Lactation Tools Discussed/Used     Consult Status Consult Status: Complete Date: 03/04/20 Follow-up type: Call as needed    Jomes Giraldo R Lady Wisham 03/04/2020, 8:15 AM

## 2020-03-22 ENCOUNTER — Ambulatory Visit (INDEPENDENT_AMBULATORY_CARE_PROVIDER_SITE_OTHER): Payer: PRIVATE HEALTH INSURANCE | Admitting: Physician Assistant

## 2020-03-22 ENCOUNTER — Encounter: Payer: Self-pay | Admitting: Physician Assistant

## 2020-03-22 ENCOUNTER — Telehealth: Payer: Self-pay

## 2020-03-22 ENCOUNTER — Other Ambulatory Visit: Payer: Self-pay

## 2020-03-22 VITALS — BP 111/78 | HR 90 | Temp 98.2°F | Ht 66.0 in | Wt 150.0 lb

## 2020-03-22 DIAGNOSIS — R300 Dysuria: Secondary | ICD-10-CM | POA: Diagnosis not present

## 2020-03-22 DIAGNOSIS — N201 Calculus of ureter: Secondary | ICD-10-CM | POA: Diagnosis not present

## 2020-03-22 MED ORDER — CIPROFLOXACIN HCL 250 MG PO TABS: 250.0000 mg | ORAL_TABLET | Freq: Two times a day (BID) | ORAL | 0 refills | Status: AC

## 2020-03-22 NOTE — Telephone Encounter (Signed)
Patient called stating she is 3 wk post pardum, had kidney stone and has stent placed , she has had a fever the past two days, highest was 101. She did call obgyn, no mastitis, has been covid tested and flu tested both negative. Patient states she spoke to Dr. Bernardo Heater privately and he advised her to follow up in our office for possible UTI

## 2020-03-22 NOTE — Progress Notes (Signed)
03/22/2020 1:49 PM   Alice Sherman 1986-10-29 [id number]  CC: Fever, frequency, dysuria  HPI: Alice Sherman is a 34 y.o. postpartum female, pregnancy complicated by a distal left ureteral calculus s/p left ureteral stent placement with Dr. Alinda Money on 01/28/2020, who presents today for evaluation of possible UTI.  Today, patient reports a 3-day history of fever up to 101F. She has had stable dysuria and urinary frequency since undergoing stent placement. She denies gross hematuria.  She has already been evaluated by her OB/GYN, who determined she does not have mastitis.  Additionally, she tested negative for both Covid and flu. She is currently breastfeeding.   In-office UA today positive for 3+ blood, 1+ protein, and 3+ leukocyte esterase; urine microscopy with >30 WBCs/HPF and 3-10 RBCs/HPF.  She is allergic to Sulfa drugs, penicillins, and cefaclor.  PMH: Past Medical History:  Diagnosis Date  . Anemia    with pregnancy  . Breast mass   . Irritable bowel syndrome (IBS)    symptoms mostly diarrhea  . Kidney stones 10/2016   r kidney stone  . Ulcerative colitis [facility])     Surgical History: Past Surgical History:  Procedure Laterality Date  . CYSTOSCOPY WITH STENT PLACEMENT Left 01/28/2020   Procedure: CYSTOSCOPY URETHRAL WITH STENT PLACEMENT;  Surgeon: Raynelle Bring, MD;  Location: [place];  Service: Urology;  Laterality: Left;  . CYSTOSCOPY WITH URETEROSCOPY AND STENT PLACEMENT Right 10/28/2016   Procedure: CYSTOSCOPY WITH RIGHT URETEROSCOPY;  Surgeon: Irine Seal, MD;  Location: [facility];  Service: Urology;  Laterality: Right;  . CYSTOSCOPY/RETROGRADE/URETEROSCOPY/STONE EXTRACTION WITH BASKET Left 01/28/2020   Procedure: POSSIBLE URETHRAL STONE REMOVAL;  Surgeon: Raynelle Bring, MD;  Location: [place];  Service: Urology;  Laterality: Left;    Home Medications:  Allergies as of 03/22/2020      Reactions   Cefaclor Hives, Swelling, Other (See Comments)   Reaction:  Lip swelling    Reaction:  Lip swelling  Reaction:Lip swelling  Reaction:Lip swelling    Sulfasalazine Hives   Codeine Hives, Nausea And Vomiting, Swelling, Other (See Comments)   Reaction:  Lip swelling   Ibuprofen Hives, Swelling, Other (See Comments)   Reaction:  Swelling of lips and hands    Penicillins Hives, Swelling, Other (See Comments)   Reaction:  Swelling of lips and hands  Has patient had a PCN reaction causing immediate rash, facial/tongue/throat swelling, SOB or lightheadedness with hypotension: Yes Has patient had a PCN reaction causing severe rash involving mucus membranes or skin necrosis: No Has patient had a PCN reaction that required hospitalization No Has patient had a PCN reaction occurring within the last 10 years: No If all of the above answers are "NO", then may proceed with Cephalosporin use.   Sulfa Antibiotics Hives      Medication List    as of March 22, 2020  1:49 PM   You have not been prescribed any medications.     Allergies:  Allergies  Allergen Reactions  . Cefaclor Hives, Swelling and Other (See Comments)    Reaction:  Lip swelling  Reaction:  Lip swelling  Reaction:Lip swelling   Reaction:Lip swelling    . Sulfasalazine Hives  . Codeine Hives, Nausea And Vomiting, Swelling and Other (See Comments)    Reaction:  Lip swelling  . Ibuprofen Hives, Swelling and Other (See Comments)    Reaction:  Swelling of lips and hands   . Penicillins Hives, Swelling and Other (See Comments)    Reaction:  Swelling of  lips and hands  Has patient had a PCN reaction causing immediate rash, facial/tongue/throat swelling, SOB or lightheadedness with hypotension: Yes Has patient had a PCN reaction causing severe rash involving mucus membranes or skin necrosis: No Has patient had a PCN reaction that required hospitalization No Has patient had a PCN reaction occurring within the last 10 years: No If all of the above answers are "NO", then may proceed with  Cephalosporin use.  . Sulfa Antibiotics Hives    Family History: Family History  Problem Relation Age of Onset  . Hypertension Mother   . Kidney Stones Father   . Diverticulitis Father   . Leukemia Paternal Grandfather     Social History:   reports that she has never smoked. She has never used smokeless tobacco. She reports previous alcohol use. She reports that she does not use drugs.  Physical Exam: BP 111/78   Pulse 90   Temp 98.2 F (36.8 C) (Skin)   Ht 5' 6"  (1.676 m)   Wt 150 lb (68 kg)   LMP 04/28/2019 (Approximate)   BMI 24.21 kg/m   Constitutional:  Alert and oriented, no acute distress, nontoxic appearing HEENT: [place], AT Cardiovascular: No clubbing, cyanosis, or edema Respiratory: Normal respiratory effort, no increased work of breathing Skin: No rashes, bruises or suspicious lesions Neurologic: Grossly intact, no focal deficits, moving all 4 extremities Psychiatric: Normal mood and affect  Laboratory Data: Results for orders placed or performed in visit on 03/22/20  Microscopic Examination   URINE  Result Value Ref Range   WBC, UA >30 (A) 0 - 5 /hpf   RBC 3-10 (A) 0 - 2 /hpf   Epithelial Cells (non renal) 0-10 0 - 10 /hpf   Bacteria, UA Few None seen/Few  Urinalysis, Complete  Result Value Ref Range   Specific Gravity, UA 1.015 1.005 - 1.030   pH, UA 7.0 5.0 - 7.5   Color, UA Yellow Yellow   Appearance Ur Cloudy (A) Clear   Leukocytes,UA 3+ (A) Negative   Protein,UA 1+ (A) Negative/Trace   Glucose, UA Negative Negative   Ketones, UA Negative Negative   RBC, UA 3+ (A) Negative   Bilirubin, UA Negative Negative   Urobilinogen, Ur 0.2 0.2 - 1.0 mg/dL   Nitrite, UA Negative Negative   Microscopic Examination See below:    Assessment & Plan:   1. Dysuria 34 year old postpartum female who experienced a distal left ureteral stone during pregnancy s/p ureteral stent placement, now with low grade fever x3 days. She is afebrile in clinic today, UA notable  for pyuria and MH.  Unclear if UA is representative of infection vs. typical stent-related findings. Given recent fever, will start patient on empiric antibiotics and send urine for culture. Starting patient on Cipro due to multiple antibiotic allergies, electing for lower dose 274m BID due to concerns for transmission through breast milk. - Urinalysis, Complete - CULTURE, URINE COMPREHENSIVE - ciprofloxacin (CIPRO) 250 MG tablet; Take 1 tablet (250 mg total) by mouth 2 (two) times daily for 7 days.  Dispense: 14 tablet; Refill: 0   2. Left ureteral stone Briefly discussed URS/LL/stent exchange. Will speak with Dr. SBernardo Heaterre: timeline for this and contact patient to schedule.  Return for We will call with plans re: stent removal and definitive stone management.  SDebroah Loop PA-C  [facility] [address] [place] [phone]

## 2020-03-23 LAB — URINALYSIS, COMPLETE
Bilirubin, UA: NEGATIVE
Glucose, UA: NEGATIVE
Ketones, UA: NEGATIVE
Nitrite, UA: NEGATIVE
Specific Gravity, UA: 1.015 (ref 1.005–1.030)
Urobilinogen, Ur: 0.2 mg/dL (ref 0.2–1.0)
pH, UA: 7 (ref 5.0–7.5)

## 2020-03-23 LAB — MICROSCOPIC EXAMINATION: WBC, UA: >30 /hpf — AB (ref 0–5)

## 2020-03-24 LAB — CULTURE, URINE COMPREHENSIVE

## 2020-04-12 ENCOUNTER — Other Ambulatory Visit
Admission: RE | Admit: 2020-04-12 | Discharge: 2020-04-12 | Disposition: A | Discharge status: Home / Self Care | Payer: PRIVATE HEALTH INSURANCE | Source: Ambulatory Visit | Attending: Urology | Admitting: Urology

## 2020-04-12 ENCOUNTER — Other Ambulatory Visit: Payer: Self-pay

## 2020-04-12 ENCOUNTER — Encounter: Payer: Self-pay | Admitting: Urology

## 2020-04-12 ENCOUNTER — Ambulatory Visit (INDEPENDENT_AMBULATORY_CARE_PROVIDER_SITE_OTHER): Payer: PRIVATE HEALTH INSURANCE | Admitting: Urology

## 2020-04-12 ENCOUNTER — Other Ambulatory Visit: Payer: Self-pay | Admitting: Radiology

## 2020-04-12 VITALS — BP 90/65 | HR 73 | Ht 61.0 in | Wt 130.0 lb

## 2020-04-12 DIAGNOSIS — N200 Calculus of kidney: Secondary | ICD-10-CM

## 2020-04-12 DIAGNOSIS — A4151 Sepsis due to Escherichia coli [E. coli]: Secondary | ICD-10-CM | POA: Diagnosis not present

## 2020-04-12 DIAGNOSIS — Z20822 Contact with and (suspected) exposure to covid-19: Secondary | ICD-10-CM | POA: Insufficient documentation

## 2020-04-12 DIAGNOSIS — N201 Calculus of ureter: Secondary | ICD-10-CM | POA: Diagnosis not present

## 2020-04-12 DIAGNOSIS — Z01812 Encounter for preprocedural laboratory examination: Secondary | ICD-10-CM | POA: Insufficient documentation

## 2020-04-12 DIAGNOSIS — R300 Dysuria: Secondary | ICD-10-CM | POA: Diagnosis not present

## 2020-04-12 DIAGNOSIS — N390 Urinary tract infection, site not specified: Secondary | ICD-10-CM | POA: Diagnosis not present

## 2020-04-12 LAB — URINALYSIS, COMPLETE
Bilirubin, UA: NEGATIVE
Glucose, UA: NEGATIVE
Ketones, UA: NEGATIVE
Nitrite, UA: NEGATIVE
Specific Gravity, UA: 1.02 (ref 1.005–1.030)
Urobilinogen, Ur: 0.2 mg/dL (ref 0.2–1.0)
pH, UA: 5.5 (ref 5.0–7.5)

## 2020-04-12 LAB — SARS CORONAVIRUS 2 (TAT 6-24 HRS): SARS Coronavirus 2: NEGATIVE

## 2020-04-12 LAB — MICROSCOPIC EXAMINATION
RBC, Urine: >30 /hpf — AB (ref 0–2)
WBC, UA: >30 /hpf — AB (ref 0–5)

## 2020-04-12 NOTE — Progress Notes (Signed)
04/12/20 10:58 AM   Alice Sherman 30-Apr-1986 [id number]  Referring provider: Lady Gary, [facility] [place],  [place] [address] Chief Complaint  Patient presents with  . Follow-up    HPI: Alice Sherman is a 34 y.o. white female who presents to discuss stent removal and stone treatment  - Onset renal colic [redacted] weeks gestation early March 2021 - CT with a 4 mm left distal ureteral calculus - Underwent cystoscopy with stent placement by Dr. Alinda Money 01/28/2020 - Delivered 03/02/2020 - Moderate stent symptoms, PA visit 4/26 with low-grade fever and dysuria; urine culture mixed flora - Stable stent symptoms  PMH: Past Medical History:  Diagnosis Date  . Anemia    with pregnancy  . Breast mass   . Irritable bowel syndrome (IBS)    symptoms mostly diarrhea  . Kidney stones 10/2016   r kidney stone  . Ulcerative colitis [facility])     Surgical History: Past Surgical History:  Procedure Laterality Date  . CYSTOSCOPY WITH STENT PLACEMENT Left 01/28/2020   Procedure: CYSTOSCOPY URETHRAL WITH STENT PLACEMENT;  Surgeon: Raynelle Bring, MD;  Location: [place];  Service: Urology;  Laterality: Left;  . CYSTOSCOPY WITH URETEROSCOPY AND STENT PLACEMENT Right 10/28/2016   Procedure: CYSTOSCOPY WITH RIGHT URETEROSCOPY;  Surgeon: Irine Seal, MD;  [facility];  Service: Urology;  Laterality: Right;  . CYSTOSCOPY/RETROGRADE/URETEROSCOPY/STONE EXTRACTION WITH BASKET Left 01/28/2020   Procedure: POSSIBLE URETHRAL STONE REMOVAL;  Surgeon: Raynelle Bring, MD;  Location: [place];  Service: Urology;  Laterality: Left;    Home Medications:  Allergies as of 04/12/2020      Reactions   Cefaclor Hives, Swelling, Other (See Comments)   Reaction:  Lip swelling  Reaction:  Lip swelling  Reaction:Lip swelling  Reaction:Lip swelling    Sulfasalazine Hives   Codeine Hives, Nausea And Vomiting, Swelling, Other (See Comments)   Reaction:  Lip swelling   Ibuprofen Hives, Swelling,  Other (See Comments)   Reaction:  Swelling of lips and hands    Penicillins Hives, Swelling, Other (See Comments)   Reaction:  Swelling of lips and hands  Has patient had a PCN reaction causing immediate rash, facial/tongue/throat swelling, SOB or lightheadedness with hypotension: Yes Has patient had a PCN reaction causing severe rash involving mucus membranes or skin necrosis: No Has patient had a PCN reaction that required hospitalization No Has patient had a PCN reaction occurring within the last 10 years: No If all of the above answers are "NO", then may proceed with Cephalosporin use.   Sulfa Antibiotics Hives      Medication List    as of Apr 12, 2020 10:58 AM   You have not been prescribed any medications.     Allergies:  Allergies  Allergen Reactions  . Cefaclor Hives, Swelling and Other (See Comments)    Reaction:  Lip swelling  Reaction:  Lip swelling  Reaction:Lip swelling   Reaction:Lip swelling    . Sulfasalazine Hives  . Codeine Hives, Nausea And Vomiting, Swelling and Other (See Comments)    Reaction:  Lip swelling  . Ibuprofen Hives, Swelling and Other (See Comments)    Reaction:  Swelling of lips and hands   . Penicillins Hives, Swelling and Other (See Comments)    Reaction:  Swelling of lips and hands  Has patient had a PCN reaction causing immediate rash, facial/tongue/throat swelling, SOB or lightheadedness with hypotension: Yes Has patient had a PCN reaction causing severe rash involving mucus  membranes or skin necrosis: No Has patient had a PCN reaction that required hospitalization No Has patient had a PCN reaction occurring within the last 10 years: No If all of the above answers are "NO", then may proceed with Cephalosporin use.  . Sulfa Antibiotics Hives    Family History: Family History  Problem Relation Age of Onset  . Hypertension Mother   . Kidney Stones Father   . Diverticulitis Father   . Leukemia Paternal Grandfather      Social History:  reports that she has never smoked. She has never used smokeless tobacco. She reports previous alcohol use. She reports that she does not use drugs.   Physical Exam: BP 90/65   Pulse 73   Ht 5' 1"  (1.549 m)   Wt 130 lb (59 kg)   LMP 04/28/2019 (Approximate)   BMI 24.56 kg/m   Constitutional:  Alert and oriented, No acute distress. HEENT: [place] AT, moist mucus membranes.  Trachea midline, no masses. Cardiovascular: No clubbing, cyanosis, or edema.  RRR Respiratory: Normal respiratory effort, no increased work of breathing.  Clear Skin: No rashes, bruises or suspicious lesions. Neurologic: Grossly intact, no focal deficits, moving all 4 extremities. Psychiatric: Normal mood and affect.   Pertinent Imaging: Images personally reviewed  CLINICAL DATA:  Flank pain kidney stone suspected  EXAM: CT ABDOMEN AND PELVIS WITHOUT CONTRAST  TECHNIQUE: Multidetector CT imaging of the abdomen and pelvis was performed following the standard protocol without IV contrast.  COMPARISON:  Renal sonogram 01/26/2020, CT abdomen and pelvis from 11/02/2017  FINDINGS: Lower chest: Basilar atelectasis and small right-sided pleural effusion. Trace left effusion. No pericardial fluid.  Hepatobiliary: Liver is unremarkable. No signs of biliary ductal dilation on noncontrast imaging.  Pancreas: Pancreas is normal.  Spleen: Spleen is normal size without focal lesion.  Adrenals/Urinary Tract: Adrenal glands are normal. Mild fullness of the ureters bilaterally. Mild perinephric stranding on the left and mild fullness of collecting systems also on the left. Left ureteral dilation to the level of a small calculus in the distal left ureter measuring 3-4 mm. Nonobstructing left intrarenal calculus also present 3-4 mm in the lower pole.  Fullness of the right ureter and collecting system, not unexpected given the size of the gravid uterus. Urinary bladder is  under distended,  Stomach/Bowel: CT appearance of stomach, small and large bowel are unremarkable. The appendix is normal.  Vascular/Lymphatic: Vascular structures in the abdomen not well assessed given lack of contrast. No abnormal dilation or perivascular stranding. No signs of adenopathy in the upper abdomen or in the retroperitoneum.  No signs of pelvic lymphadenopathy.  Reproductive: Gravid uterus with third trimester pregnancy, fetus in vertex position. Study not performed for fetal evaluation. Placenta appears to be posterior.  Other: No abdominal wall hernia or abnormality. No abdominopelvic ascites.  Musculoskeletal: No signs of acute bone finding or destructive bone process. 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  IMPRESSION: 1. 3-4 mm distal left ureteral calculus with mild ureteral distension with early distension of left renal collecting systems, also associated with mild perinephric stranding. 2. Mild fullness of the right ureter and renal collecting systems likely due to uterine compression from gravid uterus. 3.  3-4 mm nonobstructing calculus in the lower pole left kidney. 4. Large, gravid uterus with third trimester pregnancy, not well assessed, fetus in vertex position. 5. Small right and trace left pleural effusions.  Electronically Signed   By: Zetta Bills M.D.   On: 01/28/2020 09:41  Assessment &  Plan:    - Left distal ureteral calculus status post stent placement  Schedule left ureteroscopy with stone removal.  Will also attempt to remove the small left renal calculus.  The procedure was discussed in detail including potential risks of bleeding, infection.  Rare risk of ureteral injury/stricture.  The potential need for a postop stent was discussed however may not be needed with the small calculus size.  She indicated all questions were answered and desires to proceed.  [place] [address] [place], [place] [address] [phone]  I, Joneen Boers Peace, am acting as a Education administrator for Dr. Nicki Reaper C. Aster Screws.  I have reviewed the above documentation for accuracy and completeness, and I agree with the above.    Abbie Sons, MD

## 2020-04-12 NOTE — H&P (View-Only) (Signed)
04/12/20 10:58 AM   Alice Sherman 01/09/1986 [id number]  Referring provider: Lady Gary, [facility] [place],  [place] [address] Chief Complaint  Patient presents with  . Follow-up    HPI: Alice Sherman is a 34 y.o. white female who presents to discuss stent removal and stone treatment  - Onset renal colic [redacted] weeks gestation early March 2021 - CT with a 4 mm left distal ureteral calculus - Underwent cystoscopy with stent placement by Dr. Alinda Money 01/28/2020 - Delivered 03/02/2020 - Moderate stent symptoms, PA visit 4/26 with low-grade fever and dysuria; urine culture mixed flora - Stable stent symptoms  PMH: Past Medical History:  Diagnosis Date  . Anemia    with pregnancy  . Breast mass   . Irritable bowel syndrome (IBS)    symptoms mostly diarrhea  . Kidney stones 10/2016   r kidney stone  . Ulcerative colitis [facility])     Surgical History: Past Surgical History:  Procedure Laterality Date  . CYSTOSCOPY WITH STENT PLACEMENT Left 01/28/2020   Procedure: CYSTOSCOPY URETHRAL WITH STENT PLACEMENT;  Surgeon: Raynelle Bring, MD;  Location: [place];  Service: Urology;  Laterality: Left;  . CYSTOSCOPY WITH URETEROSCOPY AND STENT PLACEMENT Right 10/28/2016   Procedure: CYSTOSCOPY WITH RIGHT URETEROSCOPY;  Surgeon: Irine Seal, MD;  Location: [facility];  Service: Urology;  Laterality: Right;  . CYSTOSCOPY/RETROGRADE/URETEROSCOPY/STONE EXTRACTION WITH BASKET Left 01/28/2020   Procedure: POSSIBLE URETHRAL STONE REMOVAL;  Surgeon: Raynelle Bring, MD;  Location: [place];  Service: Urology;  Laterality: Left;    Home Medications:  Allergies as of 04/12/2020      Reactions   Cefaclor Hives, Swelling, Other (See Comments)   Reaction:  Lip swelling  Reaction:  Lip swelling  Reaction:Lip swelling  Reaction:Lip swelling    Sulfasalazine Hives   Codeine Hives, Nausea And Vomiting, Swelling, Other (See Comments)   Reaction:  Lip swelling   Ibuprofen Hives, Swelling,  Other (See Comments)   Reaction:  Swelling of lips and hands    Penicillins Hives, Swelling, Other (See Comments)   Reaction:  Swelling of lips and hands  Has patient had a PCN reaction causing immediate rash, facial/tongue/throat swelling, SOB or lightheadedness with hypotension: Yes Has patient had a PCN reaction causing severe rash involving mucus membranes or skin necrosis: No Has patient had a PCN reaction that required hospitalization No Has patient had a PCN reaction occurring within the last 10 years: No If all of the above answers are "NO", then may proceed with Cephalosporin use.   Sulfa Antibiotics Hives      Medication List    as of Apr 12, 2020 10:58 AM   You have not been prescribed any medications.     Allergies:  Allergies  Allergen Reactions  . Cefaclor Hives, Swelling and Other (See Comments)    Reaction:  Lip swelling  Reaction:  Lip swelling  Reaction:Lip swelling   Reaction:Lip swelling    . Sulfasalazine Hives  . Codeine Hives, Nausea And Vomiting, Swelling and Other (See Comments)    Reaction:  Lip swelling  . Ibuprofen Hives, Swelling and Other (See Comments)    Reaction:  Swelling of lips and hands   . Penicillins Hives, Swelling and Other (See Comments)    Reaction:  Swelling of lips and hands  Has patient had a PCN reaction causing immediate rash, facial/tongue/throat swelling, SOB or lightheadedness with hypotension: Yes Has patient had a PCN reaction causing severe rash involving mucus  membranes or skin necrosis: No Has patient had a PCN reaction that required hospitalization No Has patient had a PCN reaction occurring within the last 10 years: No If all of the above answers are "NO", then may proceed with Cephalosporin use.  . Sulfa Antibiotics Hives    Family History: Family History  Problem Relation Age of Onset  . Hypertension Mother   . Kidney Stones Father   . Diverticulitis Father   . Leukemia Paternal Grandfather      Social History:  reports that she has never smoked. She has never used smokeless tobacco. She reports previous alcohol use. She reports that she does not use drugs.   Physical Exam: BP 90/65   Pulse 73   Ht 5' 1"  (1.549 m)   Wt 130 lb (59 kg)   LMP 04/28/2019 (Approximate)   BMI 24.56 kg/m   Constitutional:  Alert and oriented, No acute distress. HEENT: [place] AT, moist mucus membranes.  Trachea midline, no masses. Cardiovascular: No clubbing, cyanosis, or edema.  RRR Respiratory: Normal respiratory effort, no increased work of breathing.  Clear Skin: No rashes, bruises or suspicious lesions. Neurologic: Grossly intact, no focal deficits, moving all 4 extremities. Psychiatric: Normal mood and affect.   Pertinent Imaging: Images personally reviewed  CLINICAL DATA:  Flank pain kidney stone suspected  EXAM: CT ABDOMEN AND PELVIS WITHOUT CONTRAST  TECHNIQUE: Multidetector CT imaging of the abdomen and pelvis was performed following the standard protocol without IV contrast.  COMPARISON:  Renal sonogram 01/26/2020, CT abdomen and pelvis from 11/02/2017  FINDINGS: Lower chest: Basilar atelectasis and small right-sided pleural effusion. Trace left effusion. No pericardial fluid.  Hepatobiliary: Liver is unremarkable. No signs of biliary ductal dilation on noncontrast imaging.  Pancreas: Pancreas is normal.  Spleen: Spleen is normal size without focal lesion.  Adrenals/Urinary Tract: Adrenal glands are normal. Mild fullness of the ureters bilaterally. Mild perinephric stranding on the left and mild fullness of collecting systems also on the left. Left ureteral dilation to the level of a small calculus in the distal left ureter measuring 3-4 mm. Nonobstructing left intrarenal calculus also present 3-4 mm in the lower pole.  Fullness of the right ureter and collecting system, not unexpected given the size of the gravid uterus. Urinary bladder is  under distended,  Stomach/Bowel: CT appearance of stomach, small and large bowel are unremarkable. The appendix is normal.  Vascular/Lymphatic: Vascular structures in the abdomen not well assessed given lack of contrast. No abnormal dilation or perivascular stranding. No signs of adenopathy in the upper abdomen or in the retroperitoneum.  No signs of pelvic lymphadenopathy.  Reproductive: Gravid uterus with third trimester pregnancy, fetus in vertex position. Study not performed for fetal evaluation. Placenta appears to be posterior.  Other: No abdominal wall hernia or abnormality. No abdominopelvic ascites.  Musculoskeletal: No signs of acute bone finding or destructive bone process. 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  IMPRESSION: 1. 3-4 mm distal left ureteral calculus with mild ureteral distension with early distension of left renal collecting systems, also associated with mild perinephric stranding. 2. Mild fullness of the right ureter and renal collecting systems likely due to uterine compression from gravid uterus. 3.  3-4 mm nonobstructing calculus in the lower pole left kidney. 4. Large, gravid uterus with third trimester pregnancy, not well assessed, fetus in vertex position. 5. Small right and trace left pleural effusions.  Electronically Signed   By: Zetta Bills M.D.   On: 01/28/2020 09:41  Assessment &  Plan:    - Left distal ureteral calculus status post stent placement  Schedule left ureteroscopy with stone removal.  Will also attempt to remove the small left renal calculus.  The procedure was discussed in detail including potential risks of bleeding, infection.  Rare risk of ureteral injury/stricture.  The potential need for a postop stent was discussed however may not be needed with the small calculus size.  She indicated all questions were answered and desires to proceed.  [place] [address] [place], [place] [address] [phone]  I, Joneen Boers Peace, am acting as a Education administrator for Dr. Nicki Reaper C. Ivi Griffith.  I have reviewed the above documentation for accuracy and completeness, and I agree with the above.    Abbie Sons, MD

## 2020-04-13 ENCOUNTER — Encounter
Admission: RE | Disposition: A | Discharge status: Home / Self Care | Payer: Self-pay | Source: Home / Self Care | Attending: Urology

## 2020-04-13 ENCOUNTER — Ambulatory Visit: Payer: PRIVATE HEALTH INSURANCE | Admitting: Anesthesiology

## 2020-04-13 ENCOUNTER — Other Ambulatory Visit: Payer: Self-pay

## 2020-04-13 ENCOUNTER — Ambulatory Visit: Payer: PRIVATE HEALTH INSURANCE

## 2020-04-13 ENCOUNTER — Encounter: Payer: Self-pay | Admitting: Urology

## 2020-04-13 ENCOUNTER — Ambulatory Visit
Admission: RE | Admit: 2020-04-13 | Discharge: 2020-04-13 | Disposition: A | Discharge status: Home / Self Care | Payer: PRIVATE HEALTH INSURANCE | Source: Home / Self Care | Attending: Urology | Admitting: Urology

## 2020-04-13 DIAGNOSIS — N200 Calculus of kidney: Secondary | ICD-10-CM

## 2020-04-13 DIAGNOSIS — N201 Calculus of ureter: Secondary | ICD-10-CM

## 2020-04-13 DIAGNOSIS — N202 Calculus of kidney with calculus of ureter: Secondary | ICD-10-CM | POA: Insufficient documentation

## 2020-04-13 HISTORY — PX: CYSTOSCOPY W/ URETERAL STENT REMOVAL: SHX1430

## 2020-04-13 HISTORY — PX: CYSTOSCOPY WITH URETEROSCOPY, STONE BASKETRY AND STENT PLACEMENT: SHX6378

## 2020-04-13 LAB — POCT PREGNANCY, URINE: Preg Test, Ur: NEGATIVE

## 2020-04-13 SURGERY — CYSTOSCOPY, WITH CALCULUS MANIPULATION OR REMOVAL
Anesthesia: General | Site: Ureter | Laterality: Left

## 2020-04-13 MED ORDER — IOHEXOL 180 MG/ML  SOLN
INTRAMUSCULAR | Status: DC | PRN
  Administered 2020-04-13: 20 mL

## 2020-04-13 MED ORDER — FAMOTIDINE 20 MG PO TABS
ORAL_TABLET | ORAL | Status: AC
  Administered 2020-04-13: 20 mg via ORAL
  Filled 2020-04-13: qty 1

## 2020-04-13 MED ORDER — ONDANSETRON HCL 4 MG/2ML IJ SOLN: 4.0000 mg | Freq: Once | INTRAMUSCULAR | Status: DC | PRN

## 2020-04-13 MED ORDER — CIPROFLOXACIN IN D5W 400 MG/200ML IV SOLN
INTRAVENOUS | Status: AC
  Filled 2020-04-13: qty 200

## 2020-04-13 MED ORDER — FAMOTIDINE 20 MG PO TABS: 20.0000 mg | ORAL_TABLET | Freq: Once | ORAL | Status: AC

## 2020-04-13 MED ORDER — PROPOFOL 10 MG/ML IV BOLUS
INTRAVENOUS | Status: DC | PRN
  Administered 2020-04-13: 150 mg via INTRAVENOUS

## 2020-04-13 MED ORDER — MIDAZOLAM HCL 2 MG/2ML IJ SOLN
INTRAMUSCULAR | Status: DC | PRN
  Administered 2020-04-13: 2 mg via INTRAVENOUS

## 2020-04-13 MED ORDER — DEXAMETHASONE SODIUM PHOSPHATE 10 MG/ML IJ SOLN
INTRAMUSCULAR | Status: DC | PRN
  Administered 2020-04-13: 10 mg via INTRAVENOUS

## 2020-04-13 MED ORDER — MIDAZOLAM HCL 2 MG/2ML IJ SOLN
INTRAMUSCULAR | Status: AC
  Filled 2020-04-13: qty 2

## 2020-04-13 MED ORDER — SOD CITRATE-CITRIC ACID 500-334 MG/5ML PO SOLN
30.0000 mL | Freq: Once | ORAL | Status: DC
  Filled 2020-04-13: qty 30

## 2020-04-13 MED ORDER — CIPROFLOXACIN IN D5W 400 MG/200ML IV SOLN
400.0000 mg | INTRAVENOUS | Status: AC
  Administered 2020-04-13: 400 mg via INTRAVENOUS

## 2020-04-13 MED ORDER — PHENYLEPHRINE HCL (PRESSORS) 10 MG/ML IV SOLN
INTRAVENOUS | Status: DC | PRN
  Administered 2020-04-13: 100 ug via INTRAVENOUS
  Administered 2020-04-13: 50 ug via INTRAVENOUS
  Administered 2020-04-13: 100 ug via INTRAVENOUS
  Administered 2020-04-13: 50 ug via INTRAVENOUS

## 2020-04-13 MED ORDER — DEXMEDETOMIDINE HCL IN NACL 200 MCG/50ML IV SOLN
INTRAVENOUS | Status: DC | PRN
  Administered 2020-04-13 (×2): 4 ug via INTRAVENOUS

## 2020-04-13 MED ORDER — GLYCOPYRROLATE 0.2 MG/ML IJ SOLN
INTRAMUSCULAR | Status: AC
  Filled 2020-04-13: qty 1

## 2020-04-13 MED ORDER — FENTANYL CITRATE (PF) 100 MCG/2ML IJ SOLN
INTRAMUSCULAR | Status: AC
  Filled 2020-04-13: qty 2

## 2020-04-13 MED ORDER — FENTANYL CITRATE (PF) 100 MCG/2ML IJ SOLN
INTRAMUSCULAR | Status: DC | PRN
  Administered 2020-04-13 (×3): 25 ug via INTRAVENOUS

## 2020-04-13 MED ORDER — ONDANSETRON HCL 4 MG/2ML IJ SOLN
INTRAMUSCULAR | Status: AC
  Filled 2020-04-13: qty 2

## 2020-04-13 MED ORDER — LACTATED RINGERS IV SOLN: INTRAVENOUS | Status: DC | PRN

## 2020-04-13 MED ORDER — FENTANYL CITRATE (PF) 100 MCG/2ML IJ SOLN: 25.0000 ug | INTRAMUSCULAR | Status: DC | PRN

## 2020-04-13 MED ORDER — DEXAMETHASONE SODIUM PHOSPHATE 10 MG/ML IJ SOLN
INTRAMUSCULAR | Status: AC
  Filled 2020-04-13: qty 1

## 2020-04-13 MED ORDER — ONDANSETRON HCL 4 MG/2ML IJ SOLN
INTRAMUSCULAR | Status: DC | PRN
  Administered 2020-04-13: 4 mg via INTRAVENOUS

## 2020-04-13 MED ORDER — CIPROFLOXACIN HCL 500 MG PO TABS
500.0000 mg | ORAL_TABLET | Freq: Two times a day (BID) | ORAL | 0 refills | Status: DC
Start: 2020-04-13 — End: 2020-04-18

## 2020-04-13 MED ORDER — GLYCOPYRROLATE 0.2 MG/ML IJ SOLN
INTRAMUSCULAR | Status: DC | PRN
  Administered 2020-04-13: .2 mg via INTRAVENOUS

## 2020-04-13 MED ORDER — LACTATED RINGERS IV SOLN: INTRAVENOUS | Status: DC

## 2020-04-13 MED ORDER — LIDOCAINE HCL (CARDIAC) PF 100 MG/5ML IV SOSY
PREFILLED_SYRINGE | INTRAVENOUS | Status: DC | PRN
  Administered 2020-04-13: 80 mg via INTRAVENOUS

## 2020-04-13 SURGICAL SUPPLY — 29 items
BAG DRAIN CYSTO-URO LG1000N (MISCELLANEOUS) ×3 IMPLANT
BASKET ZERO TIP 1.9FR (BASKET) IMPLANT
BRUSH SCRUB EZ 1% IODOPHOR (MISCELLANEOUS) ×3 IMPLANT
CATH URETL 5X70 OPEN END (CATHETERS) IMPLANT
CNTNR SPEC 2.5X3XGRAD LEK (MISCELLANEOUS)
CONT SPEC 4OZ STER OR WHT (MISCELLANEOUS)
CONTAINER SPEC 2.5X3XGRAD LEK (MISCELLANEOUS) IMPLANT
DRAPE UTILITY 15X26 TOWEL STRL (DRAPES) ×3 IMPLANT
FIBER LASER TRACTIP 200 (UROLOGICAL SUPPLIES) ×3 IMPLANT
GLOVE BIOGEL PI IND STRL 7.5 (GLOVE) ×2 IMPLANT
GLOVE BIOGEL PI INDICATOR 7.5 (GLOVE) ×1
GOWN STRL REUS W/ TWL LRG LVL3 (GOWN DISPOSABLE) ×2 IMPLANT
GOWN STRL REUS W/ TWL XL LVL3 (GOWN DISPOSABLE) ×2 IMPLANT
GOWN STRL REUS W/TWL LRG LVL3 (GOWN DISPOSABLE) ×1
GOWN STRL REUS W/TWL XL LVL3 (GOWN DISPOSABLE) ×1
GUIDEWIRE STR DUAL SENSOR (WIRE) ×3 IMPLANT
INFUSOR MANOMETER BAG 3000ML (MISCELLANEOUS) ×3 IMPLANT
INTRODUCER DILATOR DOUBLE (INTRODUCER) IMPLANT
KIT TURNOVER CYSTO (KITS) ×3 IMPLANT
PACK CYSTO AR (MISCELLANEOUS) ×3 IMPLANT
SET CYSTO W/LG BORE CLAMP LF (SET/KITS/TRAYS/PACK) ×3 IMPLANT
SHEATH URETERAL 12FRX35CM (MISCELLANEOUS) IMPLANT
SOL .9 NS 3000ML IRR  AL (IV SOLUTION) ×1
SOL .9 NS 3000ML IRR UROMATIC (IV SOLUTION) ×2 IMPLANT
STENT URET 6FRX24 CONTOUR (STENTS) IMPLANT
STENT URET 6FRX26 CONTOUR (STENTS) IMPLANT
SURGILUBE 2OZ TUBE FLIPTOP (MISCELLANEOUS) ×3 IMPLANT
VALVE UROSEAL ADJ ENDO (VALVE) IMPLANT
WATER STERILE IRR 1000ML POUR (IV SOLUTION) ×3 IMPLANT

## 2020-04-13 NOTE — Interval H&P Note (Signed)
History and Physical Interval Note:  04/13/2020 1:25 PM  Alice Sherman  has presented today for surgery, with the diagnosis of Left ureteral and renal calculi.  The various methods of treatment have been discussed with the patient and family. After consideration of risks, benefits and other options for treatment, the patient has consented to  Procedure(s): CYSTOSCOPY WITH URETEROSCOPY, STONE BASKETRY (Left) CYSTOSCOPY/URETEROSCOPY/HOLMIUM LASER/STENT Exchange (Left) as a surgical intervention.  The patient's history has been reviewed, patient examined, no change in status, stable for surgery.  I have reviewed the patient's chart and labs.  Questions were answered to the patient's satisfaction.     [place]

## 2020-04-13 NOTE — Anesthesia Procedure Notes (Signed)
Procedure Name: LMA Insertion Date/Time: 04/13/2020 1:50 PM Performed by: Lily Peer, [place], RN Pre-anesthesia Checklist: Patient identified, Patient being monitored, Emergency Drugs available and Suction available Patient Re-evaluated:Patient Re-evaluated prior to induction Oxygen Delivery Method: Circle system utilized Preoxygenation: Pre-oxygenation with 100% oxygen Induction Type: IV induction Ventilation: Mask ventilation without difficulty LMA: LMA inserted LMA Size: 4.0 Tube type: Oral Number of attempts: 1 Placement Confirmation: positive ETCO2 and breath sounds checked- equal and bilateral Tube secured with: Tape Dental Injury: Teeth and Oropharynx as per pre-operative assessment

## 2020-04-13 NOTE — Anesthesia Postprocedure Evaluation (Signed)
Anesthesia Post Note  Patient: Alice Sherman  Procedure(s) Performed: CYSTOSCOPY WITH URETEROSCOPY, STONE BASKETRY (Left ) CYSTOSCOPY/URETEROSCOPY/HOLMIUM LASER/STENT EXCHANGE (Left Ureter)  Patient location during evaluation: PACU Anesthesia Type: General Level of consciousness: awake and alert Pain management: pain level controlled Vital Signs Assessment: post-procedure vital signs reviewed and stable Respiratory status: spontaneous breathing and respiratory function stable Cardiovascular status: stable Anesthetic complications: no     Last Vitals:  Vitals:   04/13/20 1509 04/13/20 1518  BP: 101/66 98/73  Pulse: 74 71  Resp: 15 16  Temp: (!) 36 C (!) 36 C  SpO2: 100% 100%    Last Pain:  Vitals:   04/13/20 1518  TempSrc: Temporal  PainSc: 0-No pain                 KEPHART,WILLIAM K

## 2020-04-13 NOTE — Anesthesia Preprocedure Evaluation (Signed)
Anesthesia Evaluation  Patient identified by MRN, date of birth, ID band Patient awake    Reviewed: Allergy & Precautions, NPO status , Patient's Chart, lab work & pertinent test results  History of Anesthesia Complications Negative for: history of anesthetic complications  Airway Mallampati: II       Dental   Pulmonary neg sleep apnea, neg COPD, Not current smoker,           Cardiovascular (-) hypertension(-) Past MI and (-) CHF (-) dysrhythmias      Neuro/Psych neg Seizures    GI/Hepatic Neg liver ROS, neg GERD  ,  Endo/Other  neg diabetes  Renal/GU Renal disease (stones)     Musculoskeletal   Abdominal   Peds  Hematology   Anesthesia Other Findings   Reproductive/Obstetrics                             Anesthesia Physical Anesthesia Plan  ASA: II  Anesthesia Plan: General   Post-op Pain Management:    Induction: Intravenous  PONV Risk Score and Plan: 3 and Dexamethasone, Ondansetron and Midazolam  Airway Management Planned: Oral ETT and LMA  Additional Equipment:   Intra-op Plan:   Post-operative Plan:   Informed Consent: I have reviewed the patients History and Physical, chart, labs and discussed the procedure including the risks, benefits and alternatives for the proposed anesthesia with the patient or authorized representative who has indicated his/her understanding and acceptance.       Plan Discussed with:   Anesthesia Plan Comments:         Anesthesia Quick Evaluation

## 2020-04-13 NOTE — Op Note (Signed)
Preoperative diagnosis:  1.  Left distal ureteral calculus 2.  Left nephrolithiasis  Postoperative diagnosis: 1.  Left nephrolithiasis  Procedure:  1. Cystoscopy with left ureteral stent removal 2. Left ureteroscopy and stone removal 3. Left retrograde pyelography with interpretation  Surgeon: Alice Sherman, M.D.  Anesthesia: General  Complications: None  Intraoperative findings:  1.  Left retrograde pyelography post procedure showed no filling defects, stone fragments or contrast extravasation  EBL: Minimal  Specimens: 1. Calculus for analysis   Indication: Alice Sherman is a 34 y.o. year old who developed left renal colic at [redacted] weeks gestation and had stent placement by Dr. Alinda Money.  CT showed a 4 mm left distal ureteral calculus and a 3-4 mm left lower pole calculus.  After reviewing the management options for treatment, the patient elected to proceed with the above surgical procedure(s). We have discussed the potential benefits and risks of the procedure, side effects of the proposed treatment, the likelihood of the patient achieving the goals of the procedure, and any potential problems that might occur during the procedure or recuperation. Informed consent has been obtained.  Description of procedure:  The patient was taken to the operating room and general anesthesia was induced.  The patient was placed in the dorsal lithotomy position, prepped and draped in the usual sterile fashion, and preoperative antibiotics were administered. A preoperative time-out was performed.   A 21 French cystoscope was lubricated and passed per urethra panendoscopy was performed and the bladder mucosa showed no erythema, solid or papillary lesions.  There were mild inflammatory changes secondary to her indwelling stent.  The stent was grasped with endoscopic forceps and brought out to the urethral meatus.  A 0.038 Sensor wire was then placed through the stent and advanced to the renal pelvis  under fluoroscopic guidance.  The stent was removed.  A 4.5 French semirigid ureteroscope was then passed per urethra.  The left ureteral orifice was easily engaged with ureteroscope and no stone was identified in the distal ureter.  The ureteroscope was able to be passed to the UPJ and no stone was seen in the ureter.  The semirigid ureteroscope was removed and a Sherman channel flexible ureteroscope was then passed per urethra.  The ureter was entered alongside the guidewire without difficulty and the ureteroscope was advanced to the renal pelvis.  The lower pole calculus was identified and was 2 smaller calculi.  Retrograde pyelogram was performed through the ureteroscope with findings as described above.  All calyces were examined under fluoroscopic guidance and no other calculi were identified.  The 2 lower pole calculi were basketed and removed without difficulty.   The ureter was capacious from the indwelling stent and no dilation or difficulties with the ureteroscope was were encountered during the procedure and it was elected not to place a stent.  The bladder was then emptied and the procedure ended.  The patient appeared to tolerate the procedure well and without complications.  After anesthetic reversal the patient was transported to the PACU in stable condition.   Plan: As per the discharge instructions  John Giovanni, MD

## 2020-04-13 NOTE — Transfer of Care (Signed)
Immediate Anesthesia Transfer of Care Note  Patient: Alice Sherman  Procedure(s) Performed: CYSTOSCOPY WITH URETEROSCOPY, STONE BASKETRY (Left ) CYSTOSCOPY/URETEROSCOPY/HOLMIUM LASER/STENT EXCHANGE (Left Ureter)  Patient Location: PACU  Anesthesia Type:General  Level of Consciousness: awake, alert  and oriented  Airway & Oxygen Therapy: Patient Spontanous Breathing and Patient connected to face mask oxygen  Post-op Assessment: Report given to RN and Post -op Vital signs reviewed and stable  Post vital signs: Reviewed and stable  Last Vitals:  Vitals Value Taken Time  BP 101/70 04/13/20 1439  Temp 35.9 C 04/13/20 1439  Pulse 84 04/13/20 1445  Resp 21 04/13/20 1445  SpO2 100 % 04/13/20 1445  Vitals shown include unvalidated device data.  Last Pain:  Vitals:   04/13/20 1439  TempSrc:   PainSc: 0-No pain         Complications: No apparent anesthesia complications

## 2020-04-13 NOTE — Discharge Instructions (Signed)
AMBULATORY SURGERY  DISCHARGE INSTRUCTIONS   1) The drugs that you were given will stay in your system until tomorrow so for the next 24 hours you should not:  A) Drive an automobile B) Make any legal decisions C) Drink any alcoholic beverage   2) You may resume regular meals tomorrow.  Today it is better to start with liquids and gradually work up to solid foods.  You may eat anything you prefer, but it is better to start with liquids, then soup and crackers, and gradually work up to solid foods.   3) Please notify your doctor immediately if you have any unusual bleeding, trouble breathing, redness and pain at the surgery site, drainage, fever, or pain not relieved by medication.    4) Additional Instructions:        Please contact your physician with any problems or Same Day Surgery at [phone], Monday through Friday 6 am to 4 pm, or [place] at [facility] number at [phone].    DISCHARGE INSTRUCTIONS FOR KIDNEY STONE/URETERAL STENT   MEDICATIONS:  1. Resume all your other meds from home.  2.  AZO (over-the-counter) can help with the burning/stinging when you urinate.   ACTIVITY:  1. May resume regular activities in 24 hours. 2. Drink plenty of water  3. Continue to walk at home - you can still get blood clots when you are at home, so keep active, but don't over do it.  4. May return to work/school tomorrow or when you feel ready    SIGNS/SYMPTOMS TO CALL:  Please call us if you have a fever greater than 101.5, uncontrolled nausea/vomiting, uncontrolled pain, dizziness, unable to urinate, excessively bloody urine, chest pain, shortness of breath, leg swelling, leg pain, or any other concerns or questions.   Urinary frequency, urgency, bladder spasm and blood in the urine are expected  You can reach [place] at [phone].   FOLLOW-UP:  1.  If you do not have an appointment at the time of hospital discharge the office will contact you for a follow-up  in approximately 4 weeks.

## 2020-04-14 LAB — CULTURE, URINE COMPREHENSIVE

## 2020-04-15 ENCOUNTER — Inpatient Hospital Stay
Admission: EM | Admit: 2020-04-15 | Discharge: 2020-04-18 | DRG: 854 | Disposition: A | Discharge status: Home / Self Care | Payer: PRIVATE HEALTH INSURANCE | Attending: Internal Medicine | Admitting: Internal Medicine

## 2020-04-15 ENCOUNTER — Telehealth: Payer: Self-pay

## 2020-04-15 ENCOUNTER — Emergency Department: Payer: PRIVATE HEALTH INSURANCE

## 2020-04-15 ENCOUNTER — Other Ambulatory Visit: Payer: Self-pay

## 2020-04-15 ENCOUNTER — Telehealth: Payer: Self-pay | Admitting: *Deleted

## 2020-04-15 DIAGNOSIS — Z880 Allergy status to penicillin: Secondary | ICD-10-CM | POA: Diagnosis not present

## 2020-04-15 DIAGNOSIS — I959 Hypotension, unspecified: Secondary | ICD-10-CM | POA: Diagnosis present

## 2020-04-15 DIAGNOSIS — Z881 Allergy status to other antibiotic agents status: Secondary | ICD-10-CM | POA: Diagnosis not present

## 2020-04-15 DIAGNOSIS — Z885 Allergy status to narcotic agent status: Secondary | ICD-10-CM | POA: Diagnosis not present

## 2020-04-15 DIAGNOSIS — Z8249 Family history of ischemic heart disease and other diseases of the circulatory system: Secondary | ICD-10-CM | POA: Diagnosis not present

## 2020-04-15 DIAGNOSIS — Z87442 Personal history of urinary calculi: Secondary | ICD-10-CM

## 2020-04-15 DIAGNOSIS — K51919 Ulcerative colitis, unspecified with unspecified complications: Secondary | ICD-10-CM

## 2020-04-15 DIAGNOSIS — Z882 Allergy status to sulfonamides status: Secondary | ICD-10-CM

## 2020-04-15 DIAGNOSIS — Z20822 Contact with and (suspected) exposure to covid-19: Secondary | ICD-10-CM | POA: Diagnosis present

## 2020-04-15 DIAGNOSIS — N200 Calculus of kidney: Secondary | ICD-10-CM | POA: Diagnosis present

## 2020-04-15 DIAGNOSIS — N10 Acute tubulo-interstitial nephritis: Secondary | ICD-10-CM | POA: Diagnosis not present

## 2020-04-15 DIAGNOSIS — K5190 Ulcerative colitis, unspecified, without complications: Secondary | ICD-10-CM | POA: Diagnosis present

## 2020-04-15 DIAGNOSIS — A419 Sepsis, unspecified organism: Secondary | ICD-10-CM | POA: Diagnosis not present

## 2020-04-15 DIAGNOSIS — Z1623 Resistance to quinolones and fluoroquinolones: Secondary | ICD-10-CM | POA: Diagnosis present

## 2020-04-15 DIAGNOSIS — B9620 Unspecified Escherichia coli [E. coli] as the cause of diseases classified elsewhere: Secondary | ICD-10-CM | POA: Diagnosis not present

## 2020-04-15 DIAGNOSIS — E876 Hypokalemia: Secondary | ICD-10-CM | POA: Diagnosis present

## 2020-04-15 DIAGNOSIS — N12 Tubulo-interstitial nephritis, not specified as acute or chronic: Secondary | ICD-10-CM | POA: Diagnosis present

## 2020-04-15 DIAGNOSIS — Z79899 Other long term (current) drug therapy: Secondary | ICD-10-CM | POA: Diagnosis not present

## 2020-04-15 DIAGNOSIS — Z806 Family history of leukemia: Secondary | ICD-10-CM

## 2020-04-15 DIAGNOSIS — R509 Fever, unspecified: Secondary | ICD-10-CM

## 2020-04-15 DIAGNOSIS — A4151 Sepsis due to Escherichia coli [E. coli]: Secondary | ICD-10-CM | POA: Diagnosis present

## 2020-04-15 DIAGNOSIS — N390 Urinary tract infection, site not specified: Secondary | ICD-10-CM | POA: Diagnosis present

## 2020-04-15 LAB — CBC WITH DIFFERENTIAL/PLATELET
Abs Immature Granulocytes: 0.03 10*3/uL (ref 0.00–0.07)
Basophils Absolute: 0 10*3/uL (ref 0.0–0.1)
Basophils Relative: 0 %
Eosinophils Absolute: 0 10*3/uL (ref 0.0–0.5)
Eosinophils Relative: 0 %
HCT: 37.8 % (ref 36.0–46.0)
Hemoglobin: 12.1 g/dL (ref 12.0–15.0)
Immature Granulocytes: 0 %
Lymphocytes Relative: 12 %
Lymphs Abs: 1.1 10*3/uL (ref 0.7–4.0)
MCH: 29.2 pg (ref 26.0–34.0)
MCHC: 32 g/dL (ref 30.0–36.0)
MCV: 91.3 fL (ref 80.0–100.0)
Monocytes Absolute: 1.2 10*3/uL — ABNORMAL HIGH (ref 0.1–1.0)
Monocytes Relative: 13 %
Neutro Abs: 6.9 10*3/uL (ref 1.7–7.7)
Neutrophils Relative %: 75 %
Platelets: 187 10*3/uL (ref 150–400)
RBC: 4.14 MIL/uL (ref 3.87–5.11)
RDW: 12.5 % (ref 11.5–15.5)
WBC: 9.2 10*3/uL (ref 4.0–10.5)
nRBC: 0 % (ref 0.0–0.2)

## 2020-04-15 LAB — COMPREHENSIVE METABOLIC PANEL
ALT: 23 U/L (ref 0–44)
AST: 19 U/L (ref 15–41)
Albumin: 4.5 g/dL (ref 3.5–5.0)
Alkaline Phosphatase: 131 U/L — ABNORMAL HIGH (ref 38–126)
Anion gap: 11 (ref 5–15)
BUN: 11 mg/dL (ref 6–20)
CO2: 24 mmol/L (ref 22–32)
Calcium: 8.9 mg/dL (ref 8.9–10.3)
Chloride: 100 mmol/L (ref 98–111)
Creatinine, Ser: 0.62 mg/dL (ref 0.44–1.00)
GFR calc Af Amer: >60 mL/min (ref >60)
GFR calc non Af Amer: >60 mL/min (ref >60)
Glucose, Bld: 97 mg/dL (ref 70–99)
Potassium: 3.4 mmol/L — ABNORMAL LOW (ref 3.5–5.1)
Sodium: 135 mmol/L (ref 135–145)
Total Bilirubin: 1 mg/dL (ref 0.3–1.2)
Total Protein: 8.5 g/dL — ABNORMAL HIGH (ref 6.5–8.1)

## 2020-04-15 LAB — URINALYSIS, COMPLETE (UACMP) WITH MICROSCOPIC
Bacteria, UA: NONE SEEN
Bilirubin Urine: NEGATIVE
Glucose, UA: NEGATIVE mg/dL
Ketones, ur: NEGATIVE mg/dL
Nitrite: NEGATIVE
Protein, ur: NEGATIVE mg/dL
Specific Gravity, Urine: 1.014 (ref 1.005–1.030)
pH: 6 (ref 5.0–8.0)

## 2020-04-15 LAB — LACTIC ACID, PLASMA
Lactic Acid, Venous: 1.2 mmol/L (ref 0.5–1.9)
Lactic Acid, Venous: 1.3 mmol/L (ref 0.5–1.9)

## 2020-04-15 LAB — SARS CORONAVIRUS 2 BY RT PCR (HOSPITAL ORDER, PERFORMED IN ~~LOC~~ HOSPITAL LAB): SARS Coronavirus 2: NEGATIVE

## 2020-04-15 LAB — PROTIME-INR
INR: 1.1 (ref 0.8–1.2)
Prothrombin Time: 13.7 seconds (ref 11.4–15.2)

## 2020-04-15 LAB — PROCALCITONIN: Procalcitonin: <0.1 ng/mL

## 2020-04-15 LAB — POCT PREGNANCY, URINE: Preg Test, Ur: NEGATIVE

## 2020-04-15 MED ORDER — SODIUM CHLORIDE 0.9 % IV BOLUS
1000.0000 mL | Freq: Once | INTRAVENOUS | Status: AC
  Administered 2020-04-15: 1000 mL via INTRAVENOUS

## 2020-04-15 MED ORDER — POTASSIUM CHLORIDE CRYS ER 20 MEQ PO TBCR
20.0000 meq | EXTENDED_RELEASE_TABLET | Freq: Once | ORAL | Status: AC
  Administered 2020-04-15: 20 meq via ORAL
  Filled 2020-04-15: qty 1

## 2020-04-15 MED ORDER — ONDANSETRON HCL 4 MG/2ML IJ SOLN
4.0000 mg | Freq: Three times a day (TID) | INTRAMUSCULAR | Status: DC | PRN
  Administered 2020-04-15: 4 mg via INTRAVENOUS
  Filled 2020-04-15: qty 2

## 2020-04-15 MED ORDER — ACETAMINOPHEN 325 MG PO TABS
650.0000 mg | ORAL_TABLET | Freq: Once | ORAL | Status: AC | PRN
  Administered 2020-04-15: 650 mg via ORAL

## 2020-04-15 MED ORDER — VANCOMYCIN HCL IN DEXTROSE 1-5 GM/200ML-% IV SOLN
1000.0000 mg | Freq: Once | INTRAVENOUS | Status: AC
  Administered 2020-04-15: 1000 mg via INTRAVENOUS
  Filled 2020-04-15: qty 200

## 2020-04-15 MED ORDER — METRONIDAZOLE IN NACL 5-0.79 MG/ML-% IV SOLN
500.0000 mg | Freq: Once | INTRAVENOUS | Status: AC
  Administered 2020-04-15: 500 mg via INTRAVENOUS
  Filled 2020-04-15: qty 100

## 2020-04-15 MED ORDER — ACETAMINOPHEN 325 MG PO TABS
650.0000 mg | ORAL_TABLET | Freq: Four times a day (QID) | ORAL | Status: DC | PRN
  Administered 2020-04-15 – 2020-04-16 (×4): 650 mg via ORAL
  Filled 2020-04-15 (×4): qty 2

## 2020-04-15 MED ORDER — SODIUM CHLORIDE 0.9 % IV SOLN
2.0000 g | Freq: Once | INTRAVENOUS | Status: AC
  Administered 2020-04-15: 2 g via INTRAVENOUS
  Filled 2020-04-15: qty 2

## 2020-04-15 MED ORDER — SODIUM CHLORIDE 0.9 % IV SOLN
2.0000 g | Freq: Three times a day (TID) | INTRAVENOUS | Status: DC
  Administered 2020-04-15 – 2020-04-16 (×2): 2 g via INTRAVENOUS
  Filled 2020-04-15 (×3): qty 2

## 2020-04-15 MED ORDER — VANCOMYCIN HCL 500 MG/100ML IV SOLN
500.0000 mg | Freq: Three times a day (TID) | INTRAVENOUS | Status: DC
  Administered 2020-04-16 (×2): 500 mg via INTRAVENOUS
  Filled 2020-04-15 (×4): qty 100

## 2020-04-15 MED ORDER — SODIUM CHLORIDE 0.9 % IV BOLUS (SEPSIS)
1000.0000 mL | Freq: Once | INTRAVENOUS | Status: AC
  Administered 2020-04-15: 1000 mL via INTRAVENOUS

## 2020-04-15 MED ORDER — SODIUM CHLORIDE 0.9 % IV SOLN: INTRAVENOUS | Status: DC

## 2020-04-15 MED ORDER — ENOXAPARIN SODIUM 40 MG/0.4ML ~~LOC~~ SOLN
40.0000 mg | SUBCUTANEOUS | Status: DC
  Administered 2020-04-15: 40 mg via SUBCUTANEOUS
  Filled 2020-04-15: qty 0.4

## 2020-04-15 MED ORDER — ACETAMINOPHEN 325 MG PO TABS
ORAL_TABLET | ORAL | Status: AC
  Filled 2020-04-15: qty 2

## 2020-04-15 NOTE — ED Triage Notes (Signed)
Pt comes POV with recent stent removal on Tuesday for kidney stone. Pt has had fever since yesterday afternoon. Highest at home 103. Tylenol taken at 6am.

## 2020-04-15 NOTE — H&P (Addendum)
History and Physical    Alice Sherman WGN:[id number] DOB: 1986/11/15 DOA: 04/15/2020  Referring MD/NP/PA:   PCP: Lady Gary, [facility]   Patient coming from:  The patient is coming from home.  At baseline, pt is independent for most of ADL.        Chief Complaint: Fever, chills  HPI: Alice Sherman is a 34 y.o. female with medical history significant of kidney stone, ulcerative colitis, anemia, who presents with fever, chills.  Pt had left ureteral stent placement due to kidney stone on 01/28/20. The stent was removed by urologist, Dr. Bernardo Heater on 5/18. Pt had had positive urine culture for  E. Coli on 5/17, which was sensitive to Cipro. Pt is currently taking ciprofloxacin. Pt developed fever and chills since yesterday.  She had temperature 103 at home.  Patient denies symptoms of UTI.  No hematuria.  No flank pain.  Patient does not have nausea, vomiting, diarrhea or abdominal pain.  No unilateral weakness. Of note, patient had delivery of her baby in April.  It was a vaginal delivery without complications..   ED Course: pt was found to have WBC 9.2, urinalysis (hazy appearance, trace amount of leukocyte, no bacteria, WBC 11-20), negative pregnancy test, lactic acid 1.2, INR 1.1, potassium 3.4, renal function okay, temperature one 1.4, blood pressure 119/80, tachycardia, tachypnea, oxygen saturation 99% on room air.  Chest x-ray negative.  Renal ultrasound is negative for hydronephrosis.  Patient is admitted to [facility] bed as inpatient.  Review of Systems:   General: has fevers, chills, no body weight gain, has fatigue HEENT: no blurry vision, hearing changes or sore throat Respiratory: no dyspnea, coughing, wheezing CV: no chest pain, no palpitations GI: no nausea, vomiting, abdominal pain, diarrhea, constipation GU: no dysuria, burning on urination, increased urinary frequency, hematuria  Ext: no leg edema Neuro: no unilateral weakness, numbness, or tingling, no vision change or  hearing loss Skin: no rash, no skin tear. MSK: No muscle spasm, no deformity, no limitation of range of movement in spin Heme: No easy bruising.  Travel history: No recent long distant travel.  Allergy:  Allergies  Allergen Reactions  . Cefaclor Hives, Swelling and Other (See Comments)    Lip swelling  . Sulfasalazine Hives  . Codeine Hives, Swelling and Other (See Comments)    Lip swelling  . Ibuprofen Hives, Swelling and Other (See Comments)    Swelling of lips and hands   . Penicillins Hives, Swelling and Other (See Comments)    Swelling of lips and hands  Has patient had a PCN reaction causing immediate rash, facial/tongue/throat swelling, SOB or lightheadedness with hypotension: Yes Has patient had a PCN reaction causing severe rash involving mucus membranes or skin necrosis: No Has patient had a PCN reaction that required hospitalization No Has patient had a PCN reaction occurring within the last 10 years: No If all of the above answers are "NO", then may proceed with Cephalosporin use.  . Sulfa Antibiotics Hives    Past Medical History:  Diagnosis Date  . Anemia    with pregnancy  . Breast mass   . Irritable bowel syndrome (IBS)    symptoms mostly diarrhea  . Kidney stones 10/2016   r kidney stone  . Ulcerative colitis [facility])     Past Surgical History:  Procedure Laterality Date  . CYSTOSCOPY W/ URETERAL STENT REMOVAL Left 04/13/2020   Procedure: CYSTOSCOPY/URETEROSCOPY/HOLMIUM LASER/STENT EXCHANGE;  Surgeon: Abbie Sons, MD;  Location: [facility];  Service: Urology;  Laterality: Left;  . CYSTOSCOPY WITH STENT PLACEMENT Left 01/28/2020   Procedure: CYSTOSCOPY URETHRAL WITH STENT PLACEMENT;  Surgeon: Raynelle Bring, MD;  Location: [place];  Service: Urology;  Laterality: Left;  . CYSTOSCOPY WITH URETEROSCOPY AND STENT PLACEMENT Right 10/28/2016   Procedure: CYSTOSCOPY WITH RIGHT URETEROSCOPY;  Surgeon: Irine Seal, MD;  Location: [facility];  Service: Urology;  Laterality:  Right;  . CYSTOSCOPY WITH URETEROSCOPY, STONE BASKETRY AND STENT PLACEMENT Left 04/13/2020   Procedure: CYSTOSCOPY WITH URETEROSCOPY, STONE BASKETRY;  Surgeon: Abbie Sons, MD;  Location: [facility];  Service: Urology;  Laterality: Left;  . CYSTOSCOPY/RETROGRADE/URETEROSCOPY/STONE EXTRACTION WITH BASKET Left 01/28/2020   Procedure: POSSIBLE URETHRAL STONE REMOVAL;  Surgeon: Raynelle Bring, MD;  Location: [place];  Service: Urology;  Laterality: Left;    Social History:  reports that she has never smoked. She has never used smokeless tobacco. She reports previous alcohol use. She reports that she does not use drugs.  Family History:  Family History  Problem Relation Age of Onset  . Hypertension Mother   . Kidney Stones Father   . Diverticulitis Father   . Leukemia Paternal Grandfather      Prior to Admission medications   Medication Sig Start Date End Date Taking? Authorizing Provider  acetaminophen (TYLENOL) 500 MG tablet Take 1,000 mg by mouth every 6 (six) hours as needed for moderate pain or headache.   Yes [provider]  BLISOVI 24 FE 1-20 MG-MCG(24) tablet Take 1 tablet by mouth daily. 04/14/20  Yes [provider]  ciprofloxacin (CIPRO) 500 MG tablet Take 1 tablet (500 mg total) by mouth 2 (two) times daily for 2 days. 04/13/20 04/15/20 Yes Stoioff, Ronda Fairly, MD  ENTYVIO 300 MG injection Inject 300 mg as directed every 8 (eight) weeks.  03/22/20  Yes [provider]  LECITHIN PO Take 1 tablet by mouth in the morning and at bedtime.    [provider]    Physical Exam: Vitals:   04/15/20 1702 04/15/20 1730 04/15/20 1745 04/15/20 1824  BP: (!) 95/55 (!) 94/57  101/63  Pulse: (!) 122 (!) 121 (!) 111 (!) 117  Resp: 18 (!) 21 (!) 22 17  Temp: (!) 101.2 F (38.4 C)   100 F (37.8 C)  TempSrc: Oral   Oral  SpO2: 97% 99% 96% 97%  Weight:      Height:       General: Not in acute distress HEENT:       Eyes: PERRL, EOMI, no scleral icterus.        ENT: No discharge from the ears and nose, no pharynx injection, no tonsillar enlargement.        Neck: No JVD, no bruit, no mass felt. Heme: No neck lymph node enlargement. Cardiac: S1/S2, RRR, No murmurs, No gallops or rubs. Respiratory:  No rales, wheezing, rhonchi or rubs. GI: Soft, nondistended, nontender, no rebound pain, no organomegaly, BS present. GU: No hematuria Ext: No pitting leg edema bilaterally. 2+DP/PT pulse bilaterally. Musculoskeletal: No joint deformities, No joint redness or warmth, no limitation of ROM in spin. Skin: No rashes.  Neuro: Alert, oriented X3, cranial nerves II-XII grossly intact, moves all extremities normally. Babinski's sign. Normal finger to nose test. Psych: Patient is not psychotic, no suicidal or hemocidal ideation.  Labs on Admission: I have personally reviewed following labs and imaging studies  CBC: Recent Labs  Lab 04/15/20 1154  WBC 9.2  NEUTROABS 6.9  HGB 12.1  HCT 37.8  MCV 91.3  PLT  197   Basic Metabolic Panel: Recent Labs  Lab 04/15/20 1154  NA 135  K 3.4*  CL 100  CO2 24  GLUCOSE 97  BUN 11  CREATININE 0.62  CALCIUM 8.9   GFR: Estimated Creatinine Clearance: 81.2 mL/min (by C-G formula based on SCr of 0.62 mg/dL). Liver Function Tests: Recent Labs  Lab 04/15/20 1154  AST 19  ALT 23  ALKPHOS 131*  BILITOT 1.0  PROT 8.5*  ALBUMIN 4.5   No results for input(s): LIPASE, AMYLASE in the last 168 hours. No results for input(s): AMMONIA in the last 168 hours. Coagulation Profile: Recent Labs  Lab 04/15/20 1154  INR 1.1   Cardiac Enzymes: No results for input(s): CKTOTAL, CKMB, CKMBINDEX, TROPONINI in the last 168 hours. BNP (last 3 results) No results for input(s): PROBNP in the last 8760 hours. HbA1C: No results for input(s): HGBA1C in the last 72 hours. CBG: No results for input(s): GLUCAP in the last 168 hours. Lipid Profile: No results for input(s): CHOL, HDL, LDLCALC, TRIG, CHOLHDL, LDLDIRECT in the  last 72 hours. Thyroid Function Tests: No results for input(s): TSH, T4TOTAL, FREET4, T3FREE, THYROIDAB in the last 72 hours. Anemia Panel: No results for input(s): VITAMINB12, FOLATE, FERRITIN, TIBC, IRON, RETICCTPCT in the last 72 hours. Urine analysis:    Component Value Date/Time   COLORURINE YELLOW (A) 04/15/2020 1154   APPEARANCEUR HAZY (A) 04/15/2020 1154   APPEARANCEUR Cloudy (A) 04/12/2020 0920   LABSPEC 1.014 04/15/2020 1154   PHURINE 6.0 04/15/2020 1154   GLUCOSEU NEGATIVE 04/15/2020 1154   HGBUR SMALL (A) 04/15/2020 1154   BILIRUBINUR NEGATIVE 04/15/2020 1154   BILIRUBINUR Negative 04/12/2020 0920   KETONESUR NEGATIVE 04/15/2020 1154   PROTEINUR NEGATIVE 04/15/2020 1154   NITRITE NEGATIVE 04/15/2020 1154   LEUKOCYTESUR TRACE (A) 04/15/2020 1154   Sepsis Labs: @LABRCNTIP (procalcitonin:4,lacticidven:4) ) Recent Results (from the past 240 hour(s))  CULTURE, URINE COMPREHENSIVE     Status: Abnormal   Collection Time: 04/12/20  9:20 AM   Specimen: Urine   UR  Result Value Ref Range Status   Urine Culture, Comprehensive Final report (A)  Final   Organism ID, Bacteria Escherichia coli (A)  Final    Comment: Greater than 100,000 colony forming units per mL Cefazolin with an MIC <=16 predicts susceptibility to the oral agents cefaclor, cefdinir, cefpodoxime, cefprozil, cefuroxime, cephalexin, and loracarbef when used for therapy of uncomplicated urinary tract infections due to E. coli, Klebsiella pneumoniae, and Proteus mirabilis.    ANTIMICROBIAL SUSCEPTIBILITY Comment  Final    Comment:       ** S = Susceptible; I = Intermediate; R = Resistant **                    P = Positive; N = Negative             MICS are expressed in micrograms per mL    Antibiotic                 RSLT#1    RSLT#2    RSLT#3    RSLT#4 Amoxicillin/Clavulanic Acid    I Ampicillin                     R Cefazolin                      S Cefepime  S Ceftriaxone                     S Cefuroxime                     S Ciprofloxacin                  S Ertapenem                      S Gentamicin                     S Imipenem                       S Levofloxacin                   S Meropenem                      S Nitrofurantoin                 S Piperacillin/Tazobactam        S Tetracycline                   R Tobramycin                     S Trimethoprim/Sulfa             S   Microscopic Examination     Status: Abnormal   Collection Time: 04/12/20  9:20 AM   URINE  Result Value Ref Range Status   WBC, UA >30 (A) 0 - 5 /hpf Final   RBC >30 (A) 0 - 2 /hpf Final   Epithelial Cells (non renal) 0-10 0 - 10 /hpf Final   Renal Epithel, UA 0-10 (A) None seen /hpf Final   Casts Present (A) None seen /lpf Final   Cast Type Hyaline casts N/A Final   Bacteria, UA Many (A) None seen/Few Final  SARS CORONAVIRUS 2 (TAT 6-24 HRS) Nasopharyngeal Nasopharyngeal Swab     Status: None   Collection Time: 04/12/20 10:20 AM   Specimen: Nasopharyngeal Swab  Result Value Ref Range Status   SARS Coronavirus 2 NEGATIVE NEGATIVE Final    Comment: (NOTE) SARS-CoV-2 target nucleic acids are NOT DETECTED. The SARS-CoV-2 RNA is generally detectable in upper and lower respiratory specimens during the acute phase of infection. Negative results do not preclude SARS-CoV-2 infection, do not rule out co-infections with other pathogens, and should not be used as the sole basis for treatment or other patient management decisions. Negative results must be combined with clinical observations, patient history, and epidemiological information. The expected result is Negative. Fact Sheet for Patients: SugarRoll.be Fact Sheet for Healthcare Providers: https://www.woods-mathews.com/ This test is not yet approved or cleared by the [place] FDA and  has been authorized for detection and/or diagnosis of SARS-CoV-2 by FDA under an Emergency Use  Authorization (EUA). This EUA will remain  in effect (meaning this test can be used) for the duration of the COVID-19 declaration under Section 56 4(b)(1) of the Act, 21 U.S.C. section 360bbb-3(b)(1), unless the authorization is terminated or revoked sooner. Performed at [facility], [place] [address] [place], [place] [address]   SARS Coronavirus 2 by RT PCR (hospital order, performed in [facility] hospital lab) Nasopharyngeal Nasopharyngeal Swab     Status: None  Collection Time: 04/15/20  2:37 PM   Specimen: Nasopharyngeal Swab  Result Value Ref Range Status   SARS Coronavirus 2 NEGATIVE NEGATIVE Final    Comment: (NOTE) SARS-CoV-2 target nucleic acids are NOT DETECTED. The SARS-CoV-2 RNA is generally detectable in upper and lower respiratory specimens during the acute phase of infection. The lowest concentration of SARS-CoV-2 viral copies this assay can detect is 250 copies / mL. A negative result does not preclude SARS-CoV-2 infection and should not be used as the sole basis for treatment or other patient management decisions.  A negative result may occur with improper specimen collection / handling, submission of specimen other than nasopharyngeal swab, presence of viral mutation(s) within the areas targeted by this assay, and inadequate number of viral copies (<250 copies / mL). A negative result must be combined with clinical observations, patient history, and epidemiological information. Fact Sheet for Patients:   StrictlyIdeas.no Fact Sheet for Healthcare Providers: BankingDealers.co.za This test is not yet approved or cleared  by the [place] FDA and has been authorized for detection and/or diagnosis of SARS-CoV-2 by FDA under an Emergency Use Authorization (EUA).  This EUA will remain in effect (meaning this test can be used) for the duration of the COVID-19 declaration under Section 564(b)(1) of the Act, 21  U.S.C. section 360bbb-3(b)(1), unless the authorization is terminated or revoked sooner. Performed at [facility], [address] [place], [place] [address]      Radiological Exams on Admission: DG Chest 2 View  Result Date: 04/15/2020 CLINICAL DATA:  Fever, suspected sepsis EXAM: CHEST - 2 VIEW COMPARISON:  None. FINDINGS: The heart size and mediastinal contours are within normal limits. Both lungs are clear. No pleural effusion. The visualized skeletal structures are unremarkable. IMPRESSION: No active cardiopulmonary disease. Electronically Signed   By: Macy Mis M.D.   On: 04/15/2020 13:36   US Renal  Result Date: 04/15/2020 CLINICAL DATA:  Recent stent removal EXAM: RENAL / URINARY TRACT ULTRASOUND COMPLETE COMPARISON:  None. FINDINGS: Right Kidney: Renal measurements: 11.9 x 4.6 x 6.8 cm. = volume: 192 mL. Mildly prominent extrarenal pelvis is noted. No other focal abnormality is seen. Left Kidney: Renal measurements: 12.1 x 6.6 x 6.1 cm. = volume: 254. mL. Echogenicity within normal limits. No mass or hydronephrosis visualized. Bladder: Appears normal for degree of bladder distention. Other: None. IMPRESSION: No hydronephrosis is noted. Electronically Signed   By: Inez Catalina M.D.   On: 04/15/2020 15:46     EKG:   Not done in ED  Assessment/Plan Principal Problem:   Complicated UTI (urinary tract infection) Active Problems:   Sepsis ([place])   Ulcerative colitis ([place])   Hypokalemia   Sepsis due to complicated UTI (urinary tract infection): Patient meets criteria for sepsis with fever, tachycardia.  Lactic acid is normal.  Currently hemodynamically stable.  Renal ultrasound is negative for hydronephrosis.  -will admit to med-surg bed as inpt -Antibiotics: Vancomycin and aztreonam (patient received 1 dose of Flagyl in ED) -Follow-up blood culture and urine culture -Check procalcitonin level -IVF: 3L of NS bolus in ED, followed by 125 cc/h   Ulcerative colitis  ([place]): stable. No GI symptoms. -Patient is receiving Entyvio injection every every 8 hours at home  Hypokalemia: K= 3.4  on admission. - Repleted - Check Mg level    DVT ppx: SQ Lovenox Code Status: Full code Family Communication:  Yes, patient's mother at bed side Disposition Plan:  Anticipate discharge back to previous home environment Consults called:  EDP  consulted Dr. Bernardo Heater of urology Admission status: Med-surg bed as inpt   Status is: Inpatient  Remains inpatient appropriate because:Inpatient level of care appropriate due to severity of illness.  Patient has sepsis due to complicated UTI.  Patient has history of kidney stone.  Her presentation is highly complicated.  Will need to be treated in hospital for at least 2 days.  Dispo: The patient is from: Home              Anticipated d/c is to: Home              Anticipated d/c date is: 2 days              Patient currently is not medically stable to d/c.             Date of Service 04/15/2020    Ivor Costa [facility]   If 7PM-7AM, please contact night-coverage [email or web address] 04/15/2020, 7:07 PM

## 2020-04-15 NOTE — Progress Notes (Signed)
   04/15/20 2210  Assess: MEWS Score  Temp 99.6 F (37.6 C)  BP 104/60  Pulse Rate (!) 101  Resp 16  SpO2 100 %  Assess: MEWS Score  MEWS Temp 0  MEWS Systolic 0  MEWS Pulse 1  MEWS RR 1  MEWS LOC 0  MEWS Score 2  MEWS Score Color Yellow  Assess: if the MEWS score is Yellow or Red  Were vital signs taken at a resting state? Yes  Focused Assessment Documented focused assessment  Early Detection of Sepsis Score *See Row Information* Low  MEWS guidelines implemented *See Row Information* No, previously yellow, continue vital signs every 4 hours  Notify: Charge Nurse/RN  Name of Charge Nurse/RN Notified Wilder Glade  Date Charge Nurse/RN Notified 04/15/20  Time Charge Nurse/RN Notified 2210   Patient being treated with IV antibiotics for complicated UTI. Patient admitted from ED with yellow mews. No acute changes at this time. Will continue to monitor the patient's vital signs.

## 2020-04-15 NOTE — ED Notes (Signed)
Pt wheeled to bathroom

## 2020-04-15 NOTE — ED Notes (Signed)
Pt resting in bed NAD

## 2020-04-15 NOTE — Telephone Encounter (Signed)
Patient called stating she has a 102-103 fever and chills since last night. Patient is currently on Cipro and had IV antibiotics on 04/13/2020 for procedure. As per Dr. Bernardo Heater , due to her allergies and already on antibiotics. pt should go to ED for IV antibiotics and possible admission

## 2020-04-15 NOTE — Progress Notes (Signed)
Pharmacy Antibiotic Note  Alice Sherman is a 34 y.o. female admitted on 04/15/2020. Patient with recent kidney stone and stent placement. Patient was on ciprofloxacin PTA. Urine culture 5/17 with E.coli sensitive to the ciprofloxacin. Patient presents with fever and chills. Pharmacy has been consulted for vancomycin and aztreonam dosing.  Patient with PCN allergy listed as swelling to lips. Of note, patient recently had her baby in April and is breastfeeding.  Plan: Vancomycin 1000 mg IV x 1 given. Will follow with vanc 500 mg IV q8h  Aztreonam 2 g IV q8h  Height: 5' 1"  (154.9 cm) Weight: 58.1 kg (128 lb) IBW/kg (Calculated) : 47.8  Temp (24hrs), Avg:101.1 F (38.4 C), Min:100.8 F (38.2 C), Max:101.4 F (38.6 C)  Recent Labs  Lab 04/15/20 1154 04/15/20 1437  WBC 9.2  --   CREATININE 0.62  --   LATICACIDVEN 1.2 1.3    Estimated Creatinine Clearance: 81.2 mL/min (by C-G formula based on SCr of 0.62 mg/dL).    Allergies  Allergen Reactions  . Cefaclor Hives, Swelling and Other (See Comments)    Lip swelling  . Sulfasalazine Hives  . Codeine Hives, Swelling and Other (See Comments)    Lip swelling  . Ibuprofen Hives, Swelling and Other (See Comments)    Swelling of lips and hands   . Penicillins Hives, Swelling and Other (See Comments)    Swelling of lips and hands  Has patient had a PCN reaction causing immediate rash, facial/tongue/throat swelling, SOB or lightheadedness with hypotension: Yes Has patient had a PCN reaction causing severe rash involving mucus membranes or skin necrosis: No Has patient had a PCN reaction that required hospitalization No Has patient had a PCN reaction occurring within the last 10 years: No If all of the above answers are "NO", then may proceed with Cephalosporin use.  . Sulfa Antibiotics Hives    Antimicrobials this admission: Vancomycin 5/20 >> Aztreonam 5/20 >>  Dose adjustments this admission: NA  Microbiology results: 5/20 BCx:  pending 5/20 UCx: pending   Thank you for allowing pharmacy to be a part of this patient's care.  Tawnya Crook, PharmD 04/15/2020 6:13 PM

## 2020-04-15 NOTE — ED Notes (Signed)
[place] called to get pt.

## 2020-04-15 NOTE — Telephone Encounter (Signed)
Pt aware.

## 2020-04-15 NOTE — Progress Notes (Signed)
Patient complained of nausea, current prn zofran is ordered q 8h, secure chat message sent to NP Sharion Settler

## 2020-04-15 NOTE — Telephone Encounter (Signed)
-----   Message from Abbie Sons, MD sent at 04/15/2020  7:06 AM EDT ----- Preop urine culture was positive which most likely represents colonization.  She received IV antibiotics.  Please check to see if she is having any UTI symptoms and if not it does not need to be treated

## 2020-04-15 NOTE — Progress Notes (Signed)
CODE SEPSIS - PHARMACY COMMUNICATION  **Broad Spectrum Antibiotics should be administered within 1 hour of Sepsis diagnosis**  Time Code Sepsis Called/Page Received: 1429  Antibiotics Ordered: aztreonam/vancomycin/metronidazole  Time of 1st antibiotic administration: 4784  Additional action taken by pharmacy: NA  If necessary, Name of Provider/Nurse Contacted: NA    Tawnya Crook ,PharmD Clinical Pharmacist  04/15/2020  3:05 PM

## 2020-04-15 NOTE — Progress Notes (Signed)
PHARMACY -  BRIEF ANTIBIOTIC NOTE   Pharmacy has received consult(s) for vancomycin and aztreonam from an ED provider.  The patient's profile has been reviewed for ht/wt/allergies/indication/available labs.    One time order(s) placed for vanc 1 g + aztreonam 2 g  Further antibiotics/pharmacy consults should be ordered by admitting physician if indicated.                       Thank you,  Tawnya Crook, PharmD 04/15/2020  2:38 PM

## 2020-04-15 NOTE — ED Provider Notes (Signed)
[facility] Emergency Department Provider Note  ____________________________________________   First MD Initiated Contact with Patient 04/15/20 1347     (approximate)  I have reviewed the triage vital signs and the nursing notes.   HISTORY  Chief Complaint Fever    HPI Alice Sherman is a 34 y.o. female with recent kidney stone who underwent stent removal on Tuesday, 5/18 who underwent stent placement with Dr. Bernardo Heater.  To note patient had a preop urine culture that was positive.  Patient received IV antibiotics but they had wanted to see if patient was having any symptoms.  Patient is currently on ciprofloxacin.  They had discussed with Dr. Bernardo Heater who recommended patient come to the ER for IV antibiotics and possible admission.  Patient did have delivery of her baby in April.  It was a vaginal delivery.  Patient reports having fevers and chills since yesterday, severe, constant, not better with Tylenol, last took around noon, nothing makes it worse.  She denies any recurrent right flank tenderness.  She can tell she had a recent procedure but denies anything like her prior kidney stone.  She denies any rash over her breast, denies any vaginal discharge or abdominal tenderness.   To note patient's culture was positive on 5/17 and sensitive to the Cipro for E. coli.  She had negative Covid testing.          Past Medical History:  Diagnosis Date  . Anemia    with pregnancy  . Breast mass   . Irritable bowel syndrome (IBS)    symptoms mostly diarrhea  . Kidney stones 10/2016   r kidney stone  . Ulcerative colitis [facility])     Patient Active Problem List   Diagnosis Date Noted  . Term pregnancy 03/02/2020  . Pregnancy 01/28/2020  . Acute left flank pain 01/27/2020  . Acquired neutrophilia 10/30/2017  . Indication for care in labor or delivery 12/24/2016  . Renal colic on right side 47/07/6282  . Kidney stone 10/24/2016  . Nephrolithiasis 10/24/2016     Past Surgical History:  Procedure Laterality Date  . CYSTOSCOPY W/ URETERAL STENT REMOVAL Left 04/13/2020   Procedure: CYSTOSCOPY/URETEROSCOPY/HOLMIUM LASER/STENT EXCHANGE;  Surgeon: Abbie Sons, MD;  Location: [facility] ORS;  Service: Urology;  Laterality: Left;  . CYSTOSCOPY WITH STENT PLACEMENT Left 01/28/2020   Procedure: CYSTOSCOPY URETHRAL WITH STENT PLACEMENT;  Surgeon: Raynelle Bring, MD;  Location: [place];  Service: Urology;  Laterality: Left;  . CYSTOSCOPY WITH URETEROSCOPY AND STENT PLACEMENT Right 10/28/2016   Procedure: CYSTOSCOPY WITH RIGHT URETEROSCOPY;  Surgeon: Irine Seal, MD;  Location: [facility];  Service: Urology;  Laterality: Right;  . CYSTOSCOPY WITH URETEROSCOPY, STONE BASKETRY AND STENT PLACEMENT Left 04/13/2020   Procedure: CYSTOSCOPY WITH URETEROSCOPY, STONE BASKETRY;  Surgeon: Abbie Sons, MD;  Location: [facility];  Service: Urology;  Laterality: Left;  . CYSTOSCOPY/RETROGRADE/URETEROSCOPY/STONE EXTRACTION WITH BASKET Left 01/28/2020   Procedure: POSSIBLE URETHRAL STONE REMOVAL;  Surgeon: Raynelle Bring, MD;  Location: [place];  Service: Urology;  Laterality: Left;    Prior to Admission medications   Medication Sig Start Date End Date Taking? Authorizing Provider  acetaminophen (TYLENOL) 500 MG tablet Take 1,000 mg by mouth every 6 (six) hours as needed for moderate pain or headache.    [provider]  ciprofloxacin (CIPRO) 500 MG tablet Take 1 tablet (500 mg total) by mouth 2 (two) times daily for 2 days. 04/13/20 04/15/20  Stoioff, Ronda Fairly, MD  ENTYVIO 300 MG injection Inject  300 mg as directed every 8 (eight) weeks.  03/22/20   [provider]  LECITHIN PO Take 1 tablet by mouth in the morning and at bedtime.    [provider]    Allergies Cefaclor, Sulfasalazine, Codeine, Ibuprofen, Penicillins, and Sulfa antibiotics  Family History  Problem Relation Age of Onset  . Hypertension Mother   . Kidney Stones Father   . Diverticulitis  Father   . Leukemia Paternal Grandfather     Social History Social History   Tobacco Use  . Smoking status: Never Smoker  . Smokeless tobacco: Never Used  Substance Use Topics  . Alcohol use: Not Currently    Comment: socially   . Drug use: No      Review of Systems Constitutional: Positive fevers and chills Eyes: No visual changes. ENT: No sore throat. Cardiovascular: Denies chest pain. Respiratory: Denies shortness of breath. Gastrointestinal: No abdominal pain.  No nausea, no vomiting.  No diarrhea.  No constipation. Genitourinary: Negative for dysuria. Musculoskeletal: Negative for back pain. Skin: Negative for rash. Neurological: Negative for headaches, focal weakness or numbness. All other ROS negative ____________________________________________   PHYSICAL EXAM:  VITAL SIGNS: ED Triage Vitals  Enc Vitals Group     BP 04/15/20 1143 122/85     Pulse Rate 04/15/20 1143 (!) 116     Resp 04/15/20 1143 18     Temp 04/15/20 1143 (!) 100.8 F (38.2 C)     Temp Source 04/15/20 1143 Oral     SpO2 04/15/20 1143 100 %     Weight 04/15/20 1144 129 lb (58.5 kg)     Height 04/15/20 1144 5' 1"  (1.549 m)     Head Circumference --      Peak Flow --      Pain Score 04/15/20 1148 1     Pain Loc --      Pain Edu? --      Excl. in [place]? --     Constitutional: Alert and oriented. Well appearing and in no acute distress. Eyes: Conjunctivae are normal. EOMI. Head: Atraumatic. Nose: No congestion/rhinnorhea. Mouth/Throat: Mucous membranes are moist.   Neck: No stridor. Trachea Midline. FROM Cardiovascular: Tachycardic, regular rhythm. Grossly normal heart sounds.  Good peripheral circulation. Respiratory: Normal respiratory effort.  No retractions. Lungs CTAB. Gastrointestinal: Soft and nontender. No distention. No abdominal bruits.  Musculoskeletal: No lower extremity tenderness nor edema.  No joint effusions. Neurologic:  Normal speech and language. No gross focal  neurologic deficits are appreciated.  Skin:  Skin is warm, dry and intact. No rash noted. Psychiatric: Mood and affect are normal. Speech and behavior are normal. GU: Deferred   ____________________________________________   LABS (all labs ordered are listed, but only abnormal results are displayed)  Labs Reviewed  COMPREHENSIVE METABOLIC PANEL - Abnormal; Notable for the following components:      Result Value   Potassium 3.4 (*)    Total Protein 8.5 (*)    Alkaline Phosphatase 131 (*)    All other components within normal limits  CBC WITH DIFFERENTIAL/PLATELET - Abnormal; Notable for the following components:   Monocytes Absolute 1.2 (*)    All other components within normal limits  URINALYSIS, COMPLETE (UACMP) WITH MICROSCOPIC - Abnormal; Notable for the following components:   Color, Urine YELLOW (*)    APPearance HAZY (*)    Hgb urine dipstick SMALL (*)    Leukocytes,Ua TRACE (*)    All other components within normal limits  CULTURE, BLOOD (ROUTINE X 2)  CULTURE, BLOOD (ROUTINE X 2)  SARS CORONAVIRUS 2 BY RT PCR (HOSPITAL ORDER, [facility])  LACTIC ACID, PLASMA  LACTIC ACID, PLASMA  PROTIME-INR  PROCALCITONIN  POC URINE PREG, ED  POCT PREGNANCY, URINE   ____________________________________________   RADIOLOGY Robert Bellow, personally viewed and evaluated these images (plain radiographs) as part of my medical decision making, as well as reviewing the written report by the radiologist.  ED MD interpretation: No pneumonia Official radiology report(s): DG Chest 2 View  Result Date: 04/15/2020 CLINICAL DATA:  Fever, suspected sepsis EXAM: CHEST - 2 VIEW COMPARISON:  None. FINDINGS: The heart size and mediastinal contours are within normal limits. Both lungs are clear. No pleural effusion. The visualized skeletal structures are unremarkable. IMPRESSION: No active cardiopulmonary disease. Electronically Signed   By: Macy Mis M.D.   On:  04/15/2020 13:36    ____________________________________________   PROCEDURES  Procedure(s) performed (including Critical Care):  .Critical Care Performed by: Vanessa [place], MD Authorized by: Vanessa Sequoyah, MD   Critical care provider statement:    Critical care time (minutes):  35   Critical care was necessary to treat or prevent imminent or life-threatening deterioration of the following conditions:  Sepsis   Critical care was time spent personally by me on the following activities:  Discussions with consultants, evaluation of patient's response to treatment, examination of patient, ordering and performing treatments and interventions, ordering and review of laboratory studies, ordering and review of radiographic studies, pulse oximetry, re-evaluation of patient's condition, obtaining history from patient or surrogate and review of old charts     ____________________________________________   INITIAL IMPRESSION / ASSESSMENT AND PLAN / ED COURSE  MERLINA MARCHENA was evaluated in Emergency Department on 04/15/2020 for the symptoms described in the history of present illness. She was evaluated in the context of the global COVID-19 pandemic, which necessitated consideration that the patient might be at risk for infection with the SARS-CoV-2 virus that causes COVID-19. Institutional protocols and algorithms that pertain to the evaluation of patients at risk for COVID-19 are in a state of rapid change based on information released by regulatory bodies including the CDC and federal and state organizations. These policies and algorithms were followed during the patient's care in the ED.    Patient is a 34 year old who comes in with fever and tachycardia and meet SIRS criteria.  Initially was unclear the exact source.  Chest x-ray to evaluate for pneumonia, labs to evaluate for significant white count elevation, pregnancy.  Denies any symptoms suggest Endometritis.  Denies any symptoms of mastitis.   Given the recent procedure and most concern for bacteremia given that she has failed outpatient oral ciprofloxacin for the urine culture.  Will discuss with Dr. Bernardo Heater but I think that it would be best to admit patient for IV antibiotics.  preg test negative Lactate normal White count normal  Chest xray no pna  Discussed with Dr. Bernardo Heater who agrees with admission for IV antibiotics.  Patient given full fluid resuscitation and started on broad-spectrum antibiotics.  Due to her allergies unable to do penicillins or cephalosporins.  He also wanted US abdominal ultrasound renal just to make sure there is no signs of obstruction although she is got no symptoms of kidney stone.  Will discuss with pharmacy about breast-feeding with these antibiotics discussed with patient that these antibiotics especially the Flagyl she cannot breast-feed for 24 hour after last dose.  .  Patient is okay with disposing  of breastmilk during this time  On reevaluation patient continues to look well.  Unable to treat her temperature given her allergy to ibuprofen and she already took Tylenol recently.  Still remains a little bit tachycardic but looks well perfused and well-appearing otherwise      ____________________________________________   FINAL CLINICAL IMPRESSION(S) / ED DIAGNOSES   Final diagnoses:  Sepsis, due to unspecified organism, unspecified whether acute organ dysfunction present ([place])  Fever, unspecified fever cause      MEDICATIONS GIVEN DURING THIS VISIT:  Medications  metroNIDAZOLE (FLAGYL) IVPB 500 mg (500 mg Intravenous New Bag/Given 04/15/20 1501)  vancomycin (VANCOCIN) IVPB 1000 mg/200 mL premix (has no administration in time range)  0.9 %  sodium chloride infusion (has no administration in time range)  acetaminophen (TYLENOL) tablet 650 mg (650 mg Oral Given 04/15/20 1153)  aztreonam (AZACTAM) 2 g in sodium chloride 0.9 % 100 mL IVPB (2 g Intravenous New Bag/Given 04/15/20 1451)  sodium  chloride 0.9 % bolus 1,000 mL (1,000 mLs Intravenous New Bag/Given 04/15/20 1446)    And  sodium chloride 0.9 % bolus 1,000 mL (1,000 mLs Intravenous New Bag/Given 04/15/20 1453)     ED Discharge Orders    None       Note:  This document was prepared using Dragon voice recognition software and may include unintentional dictation errors.   Vanessa [place], MD 04/15/20 1534

## 2020-04-16 LAB — BASIC METABOLIC PANEL
Anion gap: 5 (ref 5–15)
BUN: 7 mg/dL (ref 6–20)
CO2: 21 mmol/L — ABNORMAL LOW (ref 22–32)
Calcium: 7.9 mg/dL — ABNORMAL LOW (ref 8.9–10.3)
Chloride: 114 mmol/L — ABNORMAL HIGH (ref 98–111)
Creatinine, Ser: 0.44 mg/dL (ref 0.44–1.00)
GFR calc Af Amer: >60 mL/min (ref >60)
GFR calc non Af Amer: >60 mL/min (ref >60)
Glucose, Bld: 94 mg/dL (ref 70–99)
Potassium: 3.5 mmol/L (ref 3.5–5.1)
Sodium: 140 mmol/L (ref 135–145)

## 2020-04-16 LAB — CBC
HCT: 29.1 % — ABNORMAL LOW (ref 36.0–46.0)
Hemoglobin: 9.5 g/dL — ABNORMAL LOW (ref 12.0–15.0)
MCH: 29.6 pg (ref 26.0–34.0)
MCHC: 32.6 g/dL (ref 30.0–36.0)
MCV: 90.7 fL (ref 80.0–100.0)
Platelets: 163 10*3/uL (ref 150–400)
RBC: 3.21 MIL/uL — ABNORMAL LOW (ref 3.87–5.11)
RDW: 12.7 % (ref 11.5–15.5)
WBC: 8.5 10*3/uL (ref 4.0–10.5)
nRBC: 0 % (ref 0.0–0.2)

## 2020-04-16 LAB — HIV ANTIBODY (ROUTINE TESTING W REFLEX): HIV Screen 4th Generation wRfx: NONREACTIVE

## 2020-04-16 LAB — MAGNESIUM: Magnesium: 1.9 mg/dL (ref 1.7–2.4)

## 2020-04-16 MED ORDER — PROMETHAZINE HCL 25 MG/ML IJ SOLN
6.2500 mg | Freq: Four times a day (QID) | INTRAMUSCULAR | Status: DC | PRN
  Administered 2020-04-16: 6.25 mg via INTRAVENOUS
  Filled 2020-04-16: qty 1

## 2020-04-16 MED ORDER — DIPHENHYDRAMINE HCL 50 MG/ML IJ SOLN: 25.0000 mg | Freq: Four times a day (QID) | INTRAMUSCULAR | Status: DC | PRN

## 2020-04-16 MED ORDER — SODIUM CHLORIDE 0.9 % IV SOLN
1.0000 g | INTRAVENOUS | Status: DC
  Administered 2020-04-16: 1 g via INTRAVENOUS
  Filled 2020-04-16: qty 1
  Filled 2020-04-16: qty 10

## 2020-04-16 MED ORDER — SODIUM CHLORIDE 0.9 % IV BOLUS
500.0000 mL | Freq: Once | INTRAVENOUS | Status: AC
  Administered 2020-04-16: 500 mL via INTRAVENOUS

## 2020-04-16 MED ORDER — SODIUM CHLORIDE 0.9 % IV SOLN
2.0000 g | INTRAVENOUS | Status: DC
  Administered 2020-04-17 – 2020-04-18 (×2): 2 g via INTRAVENOUS
  Filled 2020-04-16: qty 2
  Filled 2020-04-16 (×2): qty 20

## 2020-04-16 NOTE — Progress Notes (Signed)
Cross Cover brief note Patient admitted with sepsis from complicated uti. UA today without infection indicators. Noted prior E coli urine culture sensitive to cipro of which pateitn was taking Patient experienced nausea,, fever and hypotension overnight. Responded to additional fluid bolus.  Reviewed history and inquired regarding current therapies for ulcerative colitis treatment compliance and past symptoms with UC flares.  Patient has been compliant with entyvio injections and follows regularly with GI  Specialist.

## 2020-04-16 NOTE — Progress Notes (Addendum)
   04/16/20 0437  Vitals  Temp 98.6 F (37 C)  Temp Source Oral  BP (!) 89/62  MAP (mmHg) 71  BP Location Left Arm  BP Method Automatic  Patient Position (if appropriate) Lying  Pulse Rate 86  Pulse Rate Source Dinamap  Resp 16  Oxygen Therapy  SpO2 97 %  O2 Device Room Air  MEWS Score  MEWS Temp 0  MEWS Systolic 1  MEWS Pulse 0  MEWS RR 1  MEWS LOC 0  MEWS Score 2  MEWS Score Color Yellow  Provider Notification  Provider Name/Title Sharion Settler  Date Provider Notified 04/16/20  Time Provider Notified [phone]  Notification Type Page (Secure chat)  Notification Reason Other (Comment) (blood pressure still low)  Response No new orders (see note)  Date of Provider Response 04/16/20  Time of Provider Response 0446    At 446 NP Randol Kern replied " I don't think her signs is actually SIRS from her Ulcerative colitis. Urine is clear, good deal on the vital. Thank you".   No new orders were made at the time. Will continue to monitor the patient.  Also per patient she is not dizzy, nor light headed.

## 2020-04-16 NOTE — Progress Notes (Signed)
[place] [facility] [place] NAME: Druscilla Petsch    MR#:  [id number]  DATE OF BIRTH:  [birth year]  SUBJECTIVE:  patient came in with high-grade fever low blood pressure. She continues to have high-grade fever of 102 last night. Feels a lot better today. Tolerating oral diet. Denies any abdominal pain. Urinating well.  Patient had episode of hypotension and high-grade fever 102 last night. Responded with IV fluids. REVIEW OF SYSTEMS:   Review of Systems  Constitutional: Negative for chills, fever and weight loss.  HENT: Negative for ear discharge, ear pain and nosebleeds.   Eyes: Negative for blurred vision, pain and discharge.  Respiratory: Negative for sputum production, shortness of breath, wheezing and stridor.   Cardiovascular: Negative for chest pain, palpitations, orthopnea and PND.  Gastrointestinal: Negative for abdominal pain, diarrhea, nausea and vomiting.  Genitourinary: Negative for frequency and urgency.  Musculoskeletal: Negative for back pain and joint pain.  Neurological: Negative for sensory change, speech change, focal weakness and weakness.  Psychiatric/Behavioral: Negative for depression and hallucinations. The patient is not nervous/anxious.    Tolerating Diet: yes Tolerating PT: not needed  DRUG ALLERGIES:   Allergies  Allergen Reactions  . Cefaclor Hives, Swelling and Other (See Comments)    Lip swelling  . Sulfasalazine Hives  . Codeine Hives, Swelling and Other (See Comments)    Lip swelling  . Ibuprofen Hives, Swelling and Other (See Comments)    Swelling of lips and hands   . Penicillins Hives, Swelling and Other (See Comments)    Swelling of lips and hands  Has patient had a PCN reaction causing immediate rash, facial/tongue/throat swelling, SOB or lightheadedness with hypotension: Yes Has patient had a PCN reaction causing severe rash involving mucus membranes or skin necrosis: No Has patient had a PCN reaction that  required hospitalization No Has patient had a PCN reaction occurring within the last 10 years: No If all of the above answers are "NO", then may proceed with Cephalosporin use.  . Sulfa Antibiotics Hives    VITALS:  Blood pressure 105/71, pulse 95, temperature 98.7 F (37.1 C), temperature source Oral, resp. rate 16, height 5' 1"  (1.549 m), weight 62.2 kg, last menstrual period 04/28/2019, SpO2 98 %, unknown if currently breastfeeding.  PHYSICAL EXAMINATION:   Physical Exam  GENERAL:  34 y.o.-year-old patient lying in the bed with no acute distress.  EYES: Pupils equal, round, reactive to light and accommodation. No scleral icterus.   HEENT: Head atraumatic, normocephalic. Oropharynx and nasopharynx clear.  NECK:  Supple, no jugular venous distention. No thyroid enlargement, no tenderness.  LUNGS: Normal breath sounds bilaterally, no wheezing, rales, rhonchi. No use of accessory muscles of respiration.  CARDIOVASCULAR: S1, S2 normal. No murmurs, rubs, or gallops.  ABDOMEN: Soft, nontender, nondistended. Bowel sounds present. No organomegaly or mass.  EXTREMITIES: No cyanosis, clubbing or edema b/l.    NEUROLOGIC: Cranial nerves II through XII are intact. No focal Motor or sensory deficits b/l.   PSYCHIATRIC:  patient is alert and oriented x 3.  SKIN: No obvious rash, lesion, or ulcer.   LABORATORY PANEL:  CBC Recent Labs  Lab 04/16/20 0356  WBC 8.5  HGB 9.5*  HCT 29.1*  PLT 163    Chemistries  Recent Labs  Lab 04/15/20 1154 04/15/20 1154 04/16/20 0356  NA 135   < > 140  K 3.4*   < > 3.5  CL 100   < > 114*  CO2 24   < >  21*  GLUCOSE 97   < > 94  BUN 11   < > 7  CREATININE 0.62   < > 0.44  CALCIUM 8.9   < > 7.9*  MG  --   --  1.9  AST 19  --   --   ALT 23  --   --   ALKPHOS 131*  --   --   BILITOT 1.0  --   --    < > = values in this interval not displayed.   Cardiac Enzymes No results for input(s): TROPONINI in the last 168 hours. RADIOLOGY:  DG Chest 2  View  Result Date: 04/15/2020 CLINICAL DATA:  Fever, suspected sepsis EXAM: CHEST - 2 VIEW COMPARISON:  None. FINDINGS: The heart size and mediastinal contours are within normal limits. Both lungs are clear. No pleural effusion. The visualized skeletal structures are unremarkable. IMPRESSION: No active cardiopulmonary disease. Electronically Signed   By: Macy Mis M.D.   On: 04/15/2020 13:36   US Renal  Result Date: 04/15/2020 CLINICAL DATA:  Recent stent removal EXAM: RENAL / URINARY TRACT ULTRASOUND COMPLETE COMPARISON:  None. FINDINGS: Right Kidney: Renal measurements: 11.9 x 4.6 x 6.8 cm. = volume: 192 mL. Mildly prominent extrarenal pelvis is noted. No other focal abnormality is seen. Left Kidney: Renal measurements: 12.1 x 6.6 x 6.1 cm. = volume: 254. mL. Echogenicity within normal limits. No mass or hydronephrosis visualized. Bladder: Appears normal for degree of bladder distention. Other: None. IMPRESSION: No hydronephrosis is noted. Electronically Signed   By: Inez Catalina M.D.   On: 04/15/2020 15:46   ASSESSMENT AND PLAN:  Alice Sherman is a 34 y.o. female with medical history significant of kidney stone, ulcerative colitis, anemia, who presents with fever, chills. Pt had left ureteral stent placement due to kidney stone on 01/28/20. The stent was removed by urologist, Dr. Bernardo Heater on 5/18. Pt had had positive urine culture for  E. Coli on 5/17, which was sensitive to Cipro. Pt is currently taking ciprofloxacin. Pt developed fever and chills since yesterday.  She had temperature 103 at home.     Sepsis due to complicated UTI (urinary tract infection): - Patient meets criteria for sepsis with fever, tachycardia.   -Lactic acid is normal.   -Currently hemodynamically stable.   -Renal ultrasound is negative for hydronephrosis. -Antibiotics: Vancomycin and aztreonam (patient received 1 dose of Flagyl in ED)--d/c vanc -will discussed with patient if she has had penicillin more than five  years ago we will try give her cephalosporin here. This was discussed with ID who will see patient in consultation. -Patient is in agreement with it. -Follow-up blood culture -- negative  - urine culture from May 20 pending -urine culture from May 17 shows E. coli - procalcitonin level <0.10 -IVF: 3L of NS bolus in ED, followed by 125 cc/h   Ulcerative colitis ([place]): stable. No GI symptoms. -Patient is receiving Entyvio injection every every 8 hours at home  Hypokalemia: K= 3.4  on admission. - Repleted -  Mg level 1.9   DVT ppx: SQ Lovenox Code Status: Full code Family Communication:   perfect patient family has been up-to-date Disposition Plan:  Anticipate discharge back to previous home environment Consults called:  EDP consulted Dr. Bernardo Heater of urology Admission status: Med-surg bed as inpt   Status is: Inpatient  Remains inpatient appropriate because:Inpatient level of care appropriate due to severity of illness.  Patient has sepsis due to complicated UTI.  Patient has history of  kidney stone.  Her presentation is highly complicated.  Will need to be treated in hospital for at least 2 days.  Dispo: The patient is from: Home  Anticipated d/c is to: Home  Anticipated d/c date is: 2 days  Patient currently is not medically stable to d/c.   TOTAL TIME TAKING CARE OF THIS PATIENT: *30* minutes.  >50% time spent on counselling and coordination of care  Note: This dictation was prepared with Dragon dictation along with smaller phrase technology. Any transcriptional errors that result from this process are unintentional.  Fritzi Mandes M.D    [facility]   CC: Primary care physician; Lady Gary, [facility] OfPatient ID: Asencion Noble, female   DOB: 01-09-1986, 34 y.o.   MRN: [id number]

## 2020-04-16 NOTE — Plan of Care (Signed)
  Problem: Fluid Volume: Goal: Hemodynamic stability will improve Outcome: Progressing   Problem: Clinical Measurements: Goal: Diagnostic test results will improve Outcome: Progressing Goal: Signs and symptoms of infection will decrease Outcome: Progressing   Problem: Respiratory: Goal: Ability to maintain adequate ventilation will improve Outcome: Progressing  See progress note.

## 2020-04-16 NOTE — Progress Notes (Signed)
   04/16/20 0339  Assess: MEWS Score  Temp 98.2 F (36.8 C)  BP (!) 86/61  Pulse Rate 73  Resp 17  SpO2 97 %  O2 Device Room Air  Assess: MEWS Score  MEWS Temp 0  MEWS Systolic 1  MEWS Pulse 0  MEWS RR 1  MEWS LOC 0  MEWS Score 2  MEWS Score Color Yellow  Assess: if the MEWS score is Yellow or Red  Were vital signs taken at a resting state? Yes  Focused Assessment Documented focused assessment  Early Detection of Sepsis Score *See Row Information* Low  MEWS guidelines implemented *See Row Information* Yes  Treat  MEWS Interventions Administered scheduled meds/treatments;Other (Comment) (assessed patient, no light headed, nor dizzy due to low bp)  Take Vital Signs  Increase Vital Sign Frequency   (continuing current mews vital sign, will due follow up BP )  Escalate  MEWS: Escalate Yellow: discuss with charge nurse/RN and consider discussing with provider and RRT (Low BP)  Notify: Charge Nurse/RN  Name of Charge Nurse/RN Notified Wilder Glade   Date Charge Nurse/RN Notified 04/16/20  Time Charge Nurse/RN Notified [id number]  Notify: Provider  Provider Name/Title Sharion Settler  Date Provider Notified 04/16/20  Time Provider Notified 0345  Notification Type Page (Secure Chat)  Notification Reason Change in status (Low blood pressure)  Response See new orders (NP ordered IV bolus)  Date of Provider Response 04/16/20  Time of Provider Response 0348  Notify: Rapid Response  Name of Rapid Response RN Notified Hiral Patel  Date Rapid Response Notified 04/16/20  Time Rapid Response Notified 0346 (Patient low blood pressure )   Started 500ML NS bolus IV. Will do follow up on BP post IV bolus.

## 2020-04-16 NOTE — Progress Notes (Addendum)
   04/15/20 2349  Assess: MEWS Score  Temp (!) 102.5 F (39.2 C)  BP 102/69  Pulse Rate (!) 109  Resp 16  SpO2 98 %  Assess: MEWS Score  MEWS Temp 2  MEWS Systolic 0  MEWS Pulse 1  MEWS RR 1  MEWS LOC 0  MEWS Score 4  MEWS Score Color Red  Assess: if the MEWS score is Yellow or Red  Were vital signs taken at a resting state? Yes  Focused Assessment Documented focused assessment  Early Detection of Sepsis Score *See Row Information* Low  MEWS guidelines implemented *See Row Information* Yes  Treat  MEWS Interventions Administered scheduled meds/treatments  Take Vital Signs  Increase Vital Sign Frequency  Yellow: Q 2hr X 2 then Q 4hr X 2, if remains yellow, continue Q 4hrs  Escalate  MEWS: Escalate Yellow: discuss with charge nurse/RN and consider discussing with provider and RRT  Notify: Charge Nurse/RN  Name of Charge Nurse/RN Notified Wilder Glade  Will administer PRN tylenol for fever, will continue to monitor patient's vital signs.   Corrections were made, MEWS was red not yellow. Review corrected progress note.

## 2020-04-16 NOTE — Progress Notes (Signed)
   04/15/20 2349  Assess: MEWS Score  Temp (!) 102.5 F (39.2 C)  BP 102/69  Pulse Rate (!) 109  Resp 16  SpO2 98 %  Assess: MEWS Score  MEWS Temp 2  MEWS Systolic 0  MEWS Pulse 1  MEWS RR 1  MEWS LOC 0  MEWS Score 4  MEWS Score Color Red  Assess: if the MEWS score is Yellow or Red  Were vital signs taken at a resting state? Yes  Focused Assessment Documented focused assessment  Early Detection of Sepsis Score *See Row Information* Low  MEWS guidelines implemented *See Row Information* Yes  Treat  MEWS Interventions Administered scheduled meds/treatments  Take Vital Signs  Increase Vital Sign Frequency  Red: Q 1hr X 4 then Q 4hr X 4, if remains red, continue Q 4hrs  Escalate  MEWS: Escalate Red: discuss with charge nurse/RN and provider, consider discussing with RRT  Notify: Charge Nurse/RN  Name of Charge Nurse/RN Notified Wilder Glade  Date Charge Nurse/RN Notified 04/16/20  Time Charge Nurse/RN Notified 0018  Notify: Provider  Provider Name/Title Sharion Settler  Date Provider Notified 04/16/20  Time Provider Notified 0019  Notification Type Page (Secure chat message)  Notification Reason Change in status (MEWS score 4/ red)  Response See new orders  Date of Provider Response 04/16/20  Time of Provider Response 0021  Notify: Rapid Response  Name of Rapid Response RN Notified Hiral Patel  Date Rapid Response Notified 04/16/20  Time Rapid Response Notified 0027

## 2020-04-16 NOTE — Progress Notes (Signed)
   04/15/20 2349  Assess: MEWS Score  Temp (!) 102.5 F (39.2 C)  BP 102/69  Pulse Rate (!) 109  Resp 16  SpO2 98 %  Assess: MEWS Score  MEWS Temp 2  MEWS Systolic 0  MEWS Pulse 1  MEWS RR 1  MEWS LOC 0  MEWS Score 4  MEWS Score Color Red  Assess: if the MEWS score is Yellow or Red  Were vital signs taken at a resting state? Yes  Focused Assessment Documented focused assessment  Early Detection of Sepsis Score *See Row Information* Low  MEWS guidelines implemented *See Row Information* Yes  Treat  MEWS Interventions Administered scheduled meds/treatments  Take Vital Signs  Increase Vital Sign Frequency  Red: Q 1hr X 4 then Q 4hr X 4, if remains red, continue Q 4hrs  Escalate  MEWS: Escalate Red: discuss with charge nurse/RN and provider, consider discussing with RRT  Notify: Charge Nurse/RN  Name of Charge Nurse/RN Notified Wilder Glade  Date Charge Nurse/RN Notified 04/16/20  Time Charge Nurse/RN Notified 0018  Notify: Provider  Provider Name/Title Sharion Settler  Date Provider Notified 04/16/20  Time Provider Notified 0019  Notification Type Page (Secure chat message)  Notification Reason Change in status (MEWS score 4/ red)  Response See new orders  Date of Provider Response 04/16/20  Time of Provider Response 0021  Notify: Rapid Response  Name of Rapid Response RN Notified Hiral Patel  Date Rapid Response Notified 04/16/20  Time Rapid Response Notified 0027  Document  Progress note created (see row info) Yes   At 0011 Oral Tylenol was given for patient's temp, at 0045 578m NS IV bolus was given. Follow vital signs at 0052 Temp 99.9, BP 97/67, RR 20, O2 sat on room air 97%.  At 0151 vital signs are Temp 98.8, BP 91/58 MAP(85), RR 19, O2 sat on room air 96%. Will continue to monitor the patient vital signs. Will notify Rapid RN and On call provider if BP decrease any lower.

## 2020-04-16 NOTE — Consult Note (Signed)
NAME: Alice Sherman  DOB: 1986-03-16  MRN: [id number]  Date/Time: 04/16/2020 12:49 PM  REQUESTING PROVIDER Dr. Posey Pronto Subjective:  REASON FOR CONSULT: fever ? Alice Sherman is a 34 y.o. female with a history of ulcerative colitis, renal stones, admitted to the hospital with fever.  Patient has had multiple hospitalizations since March 1093 with complications from kidney stone.  On January 27, 2019 when she was hospitalized with acute left flank pain and she was [redacted] weeks pregnant then and she was found to have a left kidney stone.  During that hospitalization she was taken by urologist to the OR and left ureteral stent was placed.  She was discharged home on nitrofurantoin.  In March 03, 2019 when she had a normal vaginal delivery on 03/23/2019 when she was evaluated by [facility] neurologist for fever and a UA was sent for culture and she was started on ciprofloxacin to 50 mg twice daily.  Urine culture was mixed flora.  On 04/13/2020 she underwent cystoscopy and stent removal, the left ureter was interrogated and there was no stone identified.  But in the left renal pelvis 2 small calculi was present in the lower pole and they were removed.  It was sent for analysis.  As the ureter was capacious no stent was placed.  Patient received 400 mg of IV ciprofloxacin for the procedure.  And she was sent home on with a prescription to take Cipro 500 twice daily for 2 days.  The E. coli that was cultured in the urine on 04/12/2020 was sensitive to ciprofloxacin. Patient presented to the ED on 04/15/2020 with fever of 103 since 5/19 afternoon. In the ED her vitals were temperature of 101.4, BP of 122/85, heart rate of 116, oxygen of 100%. WBC was 9.2, hemoglobin 12.1, platelet 187, creatinine 0.62.  Urine analysis showed 11-20 WBC. Ultrasound showed no hydronephrosis  History of pan ulcerative colitis diagnosed in 2018.  Colonoscopy that demonstrated severe chronic colitis to the extent of the examination.  She initially was put  on 5-ASA and prednisone with a robust response to prednisone.  However increased symptoms his prednisone was reduced and she was started on vedolizumab therapy.  She is followed at [facility] and has done well with the rituximab for maintenance.  Follow-up colonoscopy September 2019 with Mayo score of 0.   Pt says she has no flank pain, abdominal pain, dysuria, cough, sore throat, sob, chest pain, headache She says the fever started the day after the stent removal in spite of being on Cipro  Past Medical History:  Diagnosis Date  . Anemia    with pregnancy  . Breast mass   . Irritable bowel syndrome (IBS)    symptoms mostly diarrhea  . Kidney stones 10/2016   r kidney stone  . Ulcerative colitis ([place])    rt breast abscess - MSSA March 2355 Right renal colic in 7322 during her pregnancy. Past Surgical History:  Procedure Laterality Date  . CYSTOSCOPY W/ URETERAL STENT REMOVAL Left 04/13/2020   Procedure: CYSTOSCOPY/URETEROSCOPY/HOLMIUM LASER/STENT EXCHANGE;  Surgeon: Abbie Sons, MD;  Location: [facility];  Service: Urology;  Laterality: Left;  . CYSTOSCOPY WITH STENT PLACEMENT Left 01/28/2020   Procedure: CYSTOSCOPY URETHRAL WITH STENT PLACEMENT;  Surgeon: Raynelle Bring, MD;  Location: [place];  Service: Urology;  Laterality: Left;  . CYSTOSCOPY WITH URETEROSCOPY AND STENT PLACEMENT Right 10/28/2016   Procedure: CYSTOSCOPY WITH RIGHT URETEROSCOPY;  Surgeon: Irine Seal, MD;  Location: [facility];  Service: Urology;  Laterality: Right;  .  CYSTOSCOPY WITH URETEROSCOPY, STONE BASKETRY AND STENT PLACEMENT Left 04/13/2020   Procedure: CYSTOSCOPY WITH URETEROSCOPY, STONE BASKETRY;  Surgeon: Abbie Sons, MD;  Location: [facility];  Service: Urology;  Laterality: Left;  . CYSTOSCOPY/RETROGRADE/URETEROSCOPY/STONE EXTRACTION WITH BASKET Left 01/28/2020   Procedure: POSSIBLE URETHRAL STONE REMOVAL;  Surgeon: Raynelle Bring, MD;  Location: [place];  Service: Urology;  Laterality: Left;    Social History    Socioeconomic History  . Marital status: Married    Spouse name: Berenice Oehlert  . Number of children: Not on file  . Years of education: Not on file  . Highest education level: Not on file  Occupational History  . Not on file  Tobacco Use  . Smoking status: Never Smoker  . Smokeless tobacco: Never Used  Substance and Sexual Activity  . Alcohol use: Not Currently    Comment: socially   . Drug use: No  . Sexual activity: Yes    Birth control/protection: None  Other Topics Concern  . Not on file  Social History Narrative  . Not on file   Social Determinants of Health   Financial Resource Strain:   . Difficulty of Paying Living Expenses:   Food Insecurity:   . Worried About Charity fundraiser in the Last Year:   . Arboriculturist in the Last Year:   Transportation Needs:   . Film/video editor (Medical):   Marland Kitchen Lack of Transportation (Non-Medical):   Physical Activity:   . Days of Exercise per Week:   . Minutes of Exercise per Session:   Stress:   . Feeling of Stress :   Social Connections:   . Frequency of Communication with Friends and Family:   . Frequency of Social Gatherings with Friends and Family:   . Attends Religious Services:   . Active Member of Clubs or Organizations:   . Attends Archivist Meetings:   Marland Kitchen Marital Status:   Intimate Partner Violence:   . Fear of Current or Ex-Partner:   . Emotionally Abused:   Marland Kitchen Physically Abused:   . Sexually Abused:     Family History  Problem Relation Age of Onset  . Hypertension Mother   . Kidney Stones Father   . Diverticulitis Father   . Leukemia Paternal Grandfather    Allergies  Allergen Reactions  . Cefaclor Hives, Swelling and Other (See Comments)    Hand and facial swelling without difficulty breathing. Resolved with bendaryl (taken at home).  Pt in middle school at time of reaction.  Was also taking ibuprofen.    . Sulfasalazine Hives  . Codeine Hives, Swelling and Other (See Comments)     Lip swelling  . Ibuprofen Hives, Swelling and Other (See Comments)    Swelling of lips and hands   . Penicillins Hives, Swelling and Other (See Comments)    Hand and facial swelling without difficulty breathing. Resolved with bendaryl (taken at home).  Pt in middle school at time of reaction.  Was also taking ibuprofen.    . Sulfa Antibiotics Hives   ? Current Facility-Administered Medications  Medication Dose Route Frequency Provider Last Rate Last Admin  . 0.9 %  sodium chloride infusion   Intravenous Continuous Ivor Costa, MD 125 mL/hr at 04/16/20 0009 New Bag at 04/16/20 0009  . acetaminophen (TYLENOL) tablet 650 mg  650 mg Oral Q6H PRN Ivor Costa, MD   650 mg at 04/16/20 0011  . cefTRIAXone (ROCEPHIN) 1 g in sodium chloride 0.9 % 100  mL IVPB  1 g Intravenous Q24H Fritzi Mandes, MD 200 mL/hr at 04/16/20 1053 1 g at 04/16/20 1053  . diphenhydrAMINE (BENADRYL) injection 25 mg  25 mg Intravenous Q6H PRN Fritzi Mandes, MD      . ondansetron [facility]) injection 4 mg  4 mg Intravenous Q8H PRN Ivor Costa, MD   4 mg at 04/15/20 1703  . promethazine (PHENERGAN) injection 6.25 mg  6.25 mg Intravenous Q6H PRN Sharion Settler, NP   6.25 mg at 04/16/20 0046     Abtx:  Anti-infectives (From admission, onward)   Start     Dose/Rate Route Frequency Ordered Stop   04/16/20 1100  cefTRIAXone (ROCEPHIN) 1 g in sodium chloride 0.9 % 100 mL IVPB     1 g 200 mL/hr over 30 Minutes Intravenous Every 24 hours 04/16/20 0956     04/16/20 0000  vancomycin (VANCOREADY) IVPB 500 mg/100 mL  Status:  Discontinued     500 mg 100 mL/hr over 60 Minutes Intravenous Every 8 hours 04/15/20 1820 04/16/20 0827   04/15/20 2200  aztreonam (AZACTAM) 2 g in sodium chloride 0.9 % 100 mL IVPB  Status:  Discontinued     2 g 200 mL/hr over 30 Minutes Intravenous Every 8 hours 04/15/20 1820 04/16/20 0956   04/15/20 1430  aztreonam (AZACTAM) 2 g in sodium chloride 0.9 % 100 mL IVPB     2 g 200 mL/hr over 30 Minutes Intravenous  Once  04/15/20 1424 04/15/20 1609   04/15/20 1430  metroNIDAZOLE (FLAGYL) IVPB 500 mg     500 mg 100 mL/hr over 60 Minutes Intravenous  Once 04/15/20 1424 04/15/20 1610   04/15/20 1430  vancomycin (VANCOCIN) IVPB 1000 mg/200 mL premix     1,000 mg 200 mL/hr over 60 Minutes Intravenous  Once 04/15/20 1424 04/15/20 1728      REVIEW OF SYSTEMS:  Const:  fever, chills, negative weight loss Eyes: negative diplopia or visual changes, negative eye pain ENT: negative coryza, negative sore throat Resp: negative cough, hemoptysis, dyspnea Cards: negative for chest pain, palpitations, lower extremity edema GU: negative for frequency, dysuria and hematuria GI: Negative for abdominal pain, diarrhea, bleeding, constipation Skin: negative for rash and pruritus Heme: negative for easy bruising and gum/nose bleeding MS: negative for myalgias, arthralgias, back pain and muscle weakness Neurolo:negative for headaches, dizziness, vertigo, memory problems  Psych: negative for feelings of anxiety, depression  Endocrine: negative for thyroid, diabetes Allergy/Immunology-In middle school she had hives after taking amoxicillin/ibuprofen? Objective:  VITALS:  BP 109/75 (BP Location: Left Arm)   Pulse 98   Temp 99.1 F (37.3 C) (Oral)   Resp 18   Ht 5' 1"  (1.549 m)   Wt 62.2 kg   LMP 04/28/2019 (Approximate)   SpO2 99%   BMI 25.91 kg/m  PHYSICAL EXAM:  General: Alert, cooperative, no distress, appears stated age.  Head: Normocephalic, without obvious abnormality, atraumatic. Eyes: Conjunctivae clear, anicteric sclerae. Pupils are equal ENT Nares normal. No drainage or sinus tenderness. Lips, mucosa, and tongue normal. No Thrush Neck: Supple, symmetrical, no adenopathy, thyroid: non tender no carotid bruit and no JVD. Back: No CVA tenderness. Lungs: Clear to auscultation bilaterally. No Wheezing or Rhonchi. No rales. Heart: Regular rate and rhythm, no murmur, rub or gallop. Abdomen: Soft,  non-tender,not distended. Bowel sounds normal. No masses Extremities: atraumatic, no cyanosis. No edema. No clubbing Skin: No rashes or lesions. Or bruising Lymph: Cervical, supraclavicular normal. Neurologic: Grossly non-focal Pertinent Labs Lab Results CBC    Component  Value Date/Time   WBC 8.5 04/16/2020 0356   RBC 3.21 (L) 04/16/2020 0356   HGB 9.5 (L) 04/16/2020 0356   HCT 29.1 (L) 04/16/2020 0356   PLT 163 04/16/2020 0356   MCV 90.7 04/16/2020 0356   MCH 29.6 04/16/2020 0356   MCHC 32.6 04/16/2020 0356   RDW 12.7 04/16/2020 0356   LYMPHSABS 1.1 04/15/2020 1154   MONOABS 1.2 (H) 04/15/2020 1154   EOSABS 0.0 04/15/2020 1154   BASOSABS 0.0 04/15/2020 1154    CMP Latest Ref Rng & Units 04/16/2020 04/15/2020 01/30/2020  Glucose 70 - 99 mg/dL 94 97 80  BUN 6 - 20 mg/dL 7 11 6   Creatinine 0.44 - 1.00 mg/dL 0.44 0.62 0.45  Sodium 135 - 145 mmol/L 140 135 141  Potassium 3.5 - 5.1 mmol/L 3.5 3.4(L) 3.7  Chloride 98 - 111 mmol/L 114(H) 100 110  CO2 22 - 32 mmol/L 21(L) 24 20(L)  Calcium 8.9 - 10.3 mg/dL 7.9(L) 8.9 9.4  Total Protein 6.5 - 8.1 g/dL - 8.5(H) 6.5  Total Bilirubin 0.3 - 1.2 mg/dL - 1.0 0.6  Alkaline Phos 38 - 126 U/L - 131(H) 126  AST 15 - 41 U/L - 19 29  ALT 0 - 44 U/L - 23 18      Microbiology: Recent Results (from the past 240 hour(s))  CULTURE, URINE COMPREHENSIVE     Status: Abnormal   Collection Time: 04/12/20  9:20 AM   Specimen: Urine   UR  Result Value Ref Range Status   Urine Culture, Comprehensive Final report (A)  Final   Organism ID, Bacteria Escherichia coli (A)  Final    Comment: Greater than 100,000 colony forming units per mL Cefazolin with an MIC <=16 predicts susceptibility to the oral agents cefaclor, cefdinir, cefpodoxime, cefprozil, cefuroxime, cephalexin, and loracarbef when used for therapy of uncomplicated urinary tract infections due to E. coli, Klebsiella pneumoniae, and Proteus mirabilis.    ANTIMICROBIAL SUSCEPTIBILITY  Comment  Final    Comment:       ** S = Susceptible; I = Intermediate; R = Resistant **                    P = Positive; N = Negative             MICS are expressed in micrograms per mL    Antibiotic                 RSLT#1    RSLT#2    RSLT#3    RSLT#4 Amoxicillin/Clavulanic Acid    I Ampicillin                     R Cefazolin                      S Cefepime                       S Ceftriaxone                    S Cefuroxime                     S Ciprofloxacin                  S Ertapenem                      S Gentamicin  S Imipenem                       S Levofloxacin                   S Meropenem                      S Nitrofurantoin                 S Piperacillin/Tazobactam        S Tetracycline                   R Tobramycin                     S Trimethoprim/Sulfa             S   Microscopic Examination     Status: Abnormal   Collection Time: 04/12/20  9:20 AM   URINE  Result Value Ref Range Status   WBC, UA >30 (A) 0 - 5 /hpf Final   RBC >30 (A) 0 - 2 /hpf Final   Epithelial Cells (non renal) 0-10 0 - 10 /hpf Final   Renal Epithel, UA 0-10 (A) None seen /hpf Final   Casts Present (A) None seen /lpf Final   Cast Type Hyaline casts N/A Final   Bacteria, UA Many (A) None seen/Few Final  SARS CORONAVIRUS 2 (TAT 6-24 HRS) Nasopharyngeal Nasopharyngeal Swab     Status: None   Collection Time: 04/12/20 10:20 AM   Specimen: Nasopharyngeal Swab  Result Value Ref Range Status   SARS Coronavirus 2 NEGATIVE NEGATIVE Final    Comment: (NOTE) SARS-CoV-2 target nucleic acids are NOT DETECTED. The SARS-CoV-2 RNA is generally detectable in upper and lower respiratory specimens during the acute phase of infection. Negative results do not preclude SARS-CoV-2 infection, do not rule out co-infections with other pathogens, and should not be used as the sole basis for treatment or other patient management decisions. Negative results must be combined with clinical  observations, patient history, and epidemiological information. The expected result is Negative. Fact Sheet for Patients: SugarRoll.be Fact Sheet for Healthcare Providers: https://www.woods-mathews.com/ This test is not yet approved or cleared by the [place] FDA and  has been authorized for detection and/or diagnosis of SARS-CoV-2 by FDA under an Emergency Use Authorization (EUA). This EUA will remain  in effect (meaning this test can be used) for the duration of the COVID-19 declaration under Section 56 4(b)(1) of the Act, 21 U.S.C. section 360bbb-3(b)(1), unless the authorization is terminated or revoked sooner. Performed at [facility], [place] [address] [place], [place] [address]   Culture, blood (Routine x 2)     Status: None (Preliminary result)   Collection Time: 04/15/20 11:54 AM   Specimen: BLOOD  Result Value Ref Range Status   Specimen Description BLOOD RIGHT ANTECUBITAL  Final   Special Requests   Final    BOTTLES DRAWN AEROBIC AND ANAEROBIC Blood Culture results may not be optimal due to an excessive volume of blood received in culture bottles   Culture   Final    NO GROWTH < 24 HOURS Performed at [facility], [address] [place], [place] [address]    Report Status PENDING  Incomplete  Culture, blood (Routine x 2)     Status: None (Preliminary result)   Collection Time: 04/15/20  2:37 PM   Specimen: BLOOD  Result Value Ref Range Status   Specimen Description BLOOD BLOOD LEFT FOREARM  Final   Special Requests   Final    BOTTLES DRAWN AEROBIC AND ANAEROBIC Blood Culture results may not be optimal due to an excessive volume of blood received in culture bottles   Culture   Final    NO GROWTH < 24 HOURS Performed at [facility], [address], [place], [place] [address]    Report Status PENDING  Incomplete  SARS Coronavirus 2 by RT PCR (hospital order, performed in [facility] hospital lab)  Nasopharyngeal Nasopharyngeal Swab     Status: None   Collection Time: 04/15/20  2:37 PM   Specimen: Nasopharyngeal Swab  Result Value Ref Range Status   SARS Coronavirus 2 NEGATIVE NEGATIVE Final    Comment: (NOTE) SARS-CoV-2 target nucleic acids are NOT DETECTED. The SARS-CoV-2 RNA is generally detectable in upper and lower respiratory specimens during the acute phase of infection. The lowest concentration of SARS-CoV-2 viral copies this assay can detect is 250 copies / mL. A negative result does not preclude SARS-CoV-2 infection and should not be used as the sole basis for treatment or other patient management decisions.  A negative result may occur with improper specimen collection / handling, submission of specimen other than nasopharyngeal swab, presence of viral mutation(s) within the areas targeted by this assay, and inadequate number of viral copies (<250 copies / mL). A negative result must be combined with clinical observations, patient history, and epidemiological information. Fact Sheet for Patients:   StrictlyIdeas.no Fact Sheet for Healthcare Providers: BankingDealers.co.za This test is not yet approved or cleared  by the [place] FDA and has been authorized for detection and/or diagnosis of SARS-CoV-2 by FDA under an Emergency Use Authorization (EUA).  This EUA will remain in effect (meaning this test can be used) for the duration of the COVID-19 declaration under Section 564(b)(1) of the Act, 21 U.S.C. section 360bbb-3(b)(1), unless the authorization is terminated or revoked sooner. Performed at [facility], [address] [place], [place] [address]     IMAGING RESULTS: CXR clear I have personally reviewed the films ? Impression/Recommendation ? ?Fever following left ureteral stent removal on ciprofloxacin. Prior to the stent removal the urine culture had E. coli which was sensitive to  ciprofloxacin and patient was on ciprofloxacin for the procedure as well as following that.  Ultrasound does not show any hydronephrosis. So it is very odd that she has fever and continues to have inspite of appropriate antibiotics- now aztreonam which was changed to ceftriaxone Blood and urine culture pending Need to r/o any nosocomial bacteria like MRSA Also will have to r/o emphysematous changes in kidney Will add vancomycin May need CT abdomen SARS Cov 2 negative  ? Ulcerative colitis on vedolizumab every 8 weeks. Says she got it first week Colonoscopy on 08/05/2018.  Patient is 6 weeks postpartum.  Delivered on 03/02/2020 No evidence of endometritis or PID  Discussed with the patient and his mother and care team _____________________________________________ Note:  This document was prepared using Dragon voice recognition software and may include unintentional dictation errors.

## 2020-04-16 NOTE — Progress Notes (Signed)
   04/16/20 1455  Assess: MEWS Score  Temp (!) 102 F (38.9 C)  BP 108/77  Pulse Rate (!) 102  Resp 18  SpO2 99 %  O2 Device Room Air  Assess: MEWS Score  MEWS Temp 2  MEWS Systolic 0  MEWS Pulse 1  MEWS RR 0  MEWS LOC 0  MEWS Score 3  MEWS Score Color Yellow  Assess: if the MEWS score is Yellow or Red  Were vital signs taken at a resting state? Yes  Focused Assessment Documented focused assessment  Early Detection of Sepsis Score *See Row Information* Medium  MEWS guidelines implemented *See Row Information* No, previously red, continue vital signs every 4 hours  Treat  MEWS Interventions Other (Comment) (Notified Dr. Posey Pronto)  Take Vital Signs  Increase Vital Sign Frequency   (Continuing from RED mews , given PRN tylenol will recheck )  Escalate  MEWS: Escalate Yellow: discuss with charge nurse/RN and consider discussing with provider and RRT  Notify: Charge Nurse/RN  Name of Charge Nurse/RN Notified Deidra , RN  Date Charge Nurse/RN Notified 04/16/20  Time Charge Nurse/RN Notified [place]  Notify: Provider  Provider Name/Title Fritzi Mandes  Date Provider Notified 04/16/20  Time Provider Notified 1502  Notification Type Page  Notification Reason  (Febrile 102.0, ST which has been baseline )  Response No new orders  Date of Provider Response 04/16/20  Document  Patient Outcome Other (Comment) (Continue to monitor Temp)

## 2020-04-17 ENCOUNTER — Inpatient Hospital Stay: Payer: PRIVATE HEALTH INSURANCE

## 2020-04-17 ENCOUNTER — Encounter: Payer: Self-pay | Admitting: Internal Medicine

## 2020-04-17 DIAGNOSIS — R509 Fever, unspecified: Secondary | ICD-10-CM

## 2020-04-17 DIAGNOSIS — E876 Hypokalemia: Secondary | ICD-10-CM

## 2020-04-17 LAB — URINE CULTURE: Culture: 20000 — AB

## 2020-04-17 MED ORDER — IOHEXOL 300 MG/ML  SOLN
100.0000 mL | Freq: Once | INTRAMUSCULAR | Status: AC | PRN
  Administered 2020-04-17: 100 mL via INTRAVENOUS

## 2020-04-17 MED ORDER — VANCOMYCIN HCL 1500 MG/300ML IV SOLN
1500.0000 mg | INTRAVENOUS | Status: DC
  Administered 2020-04-17: 1500 mg via INTRAVENOUS
  Filled 2020-04-17 (×3): qty 300

## 2020-04-17 NOTE — Progress Notes (Signed)
Patient ID: Alice Sherman, female   DOB: 1986/04/13, 34 y.o.   MRN: [id number] CT abdomen showed left pyelonephritis--d/w pt the report No high grade fever today so far. BC negative Cont present IV abxs

## 2020-04-17 NOTE — Progress Notes (Signed)
ID cipro resistant e.coli -explains why she had fever inspite of cipro starnge that 5/17 the e.coli was sesntive to cipro. Could have rapidly developed a mutation to DNA gyrase making it resistant CT scan shows left pyelo On ceftriaxone and fever trending down If blood culture remains neg and if afebrile for 24 hrs we may consider switching to PO on discharge

## 2020-04-17 NOTE — Progress Notes (Addendum)
Pharmacy Antibiotic Note  Alice Sherman is a 34 y.o. female admitted on 04/15/2020. Patient with recent kidney stone and stent placement. Patient was on ciprofloxacin PTA. Urine culture 5/17 with E.coli sensitive to the ciprofloxacin. Patient presents with fever and chills. Pharmacy has been consulted for vancomycin dosing.  Patient with PCN allergy listed as swelling to lips. Of note, patient recently had her baby in April and is breastfeeding.  Day 3 of Antibiotics. Per Dr. Delaine Lame, changed from Aztreonam to Rocephin as patient has tolerated in past.  Plan: Patient's last received Vancomycin dose was 522m@0807  on 5/21. Due to Vancomycin reagent shortage, unable to order random level. However, patient's renal function appears to remain stable. Will avoid re-ordering loading dose and initiate maintenance dose:  Vancomycin 1500 mg IV Q 24 hrs. Goal AUC 400-550. Expected AUC: 504.0 Expected Css: 9.4 SCr used: 0.8(actual 0.44)   Height: 5' 1"  (154.9 cm) Weight: 62.2 kg (137 lb 2 oz) IBW/kg (Calculated) : 47.8  Temp (24hrs), Avg:99.2 F (37.3 C), Min:98.2 F (36.8 C), Max:102 F (38.9 C)  Recent Labs  Lab 04/15/20 1154 04/15/20 1437 04/16/20 0356  WBC 9.2  --  8.5  CREATININE 0.62  --  0.44  LATICACIDVEN 1.2 1.3  --     Estimated Creatinine Clearance: 83.8 mL/min (by C-G formula based on SCr of 0.44 mg/dL).    Allergies  Allergen Reactions  . Cefaclor Hives, Swelling and Other (See Comments)    Hand and facial swelling without difficulty breathing. Resolved with bendaryl (taken at home).  Pt in middle school at time of reaction.  Was also taking ibuprofen.    . Sulfasalazine Hives  . Codeine Hives, Swelling and Other (See Comments)    Lip swelling  . Ibuprofen Hives, Swelling and Other (See Comments)    Swelling of lips and hands   . Penicillins Hives, Swelling and Other (See Comments)    Hand and facial swelling without difficulty breathing. Resolved with bendaryl (taken  at home).  Pt in middle school at time of reaction.  Was also taking ibuprofen.    . Sulfa Antibiotics Hives    Antimicrobials this admission: Vancomycin 5/20 >> Rocephin 5/21 Aztreonam 5/20 >>5/21  Dose adjustments this admission: NA  Microbiology results: 5/20 BCx: NGTD 5/20 UCx: 20k GNR  Thank you for allowing pharmacy to be a part of this patient's care.  WPearla Dubonnet PharmD 04/17/2020 12:58 AM

## 2020-04-17 NOTE — Progress Notes (Signed)
[facility] [place] NAME: Alice Sherman    MR#:  [id number]  DATE OF BIRTH:  [birth year]  SUBJECTIVE:  patient came in with high-grade fever low blood pressure. She continues to have high-grade fever of 102 lyday am and then low grade temp Feels a lot better today. Tolerating oral diet. Denies any abdominal pain. Urinating well.  Father at bedside REVIEW OF SYSTEMS:   Review of Systems  Constitutional: Positive for fever. Negative for chills and weight loss.  HENT: Negative for ear discharge, ear pain and nosebleeds.   Eyes: Negative for blurred vision, pain and discharge.  Respiratory: Negative for sputum production, shortness of breath, wheezing and stridor.   Cardiovascular: Negative for chest pain, palpitations, orthopnea and PND.  Gastrointestinal: Negative for abdominal pain, diarrhea, nausea and vomiting.  Genitourinary: Negative for frequency and urgency.  Musculoskeletal: Negative for back pain and joint pain.  Neurological: Negative for sensory change, speech change, focal weakness and weakness.  Psychiatric/Behavioral: Negative for depression and hallucinations. The patient is not nervous/anxious.    Tolerating Diet: yes Tolerating PT: not needed  DRUG ALLERGIES:   Allergies  Allergen Reactions  . Cefaclor Hives, Swelling and Other (See Comments)    Hand and facial swelling without difficulty breathing. Resolved with bendaryl (taken at home).  Pt in middle school at time of reaction.  Was also taking ibuprofen.    . Sulfasalazine Hives  . Codeine Hives, Swelling and Other (See Comments)    Lip swelling  . Ibuprofen Hives, Swelling and Other (See Comments)    Swelling of lips and hands   . Penicillins Hives, Swelling and Other (See Comments)    Hand and facial swelling without difficulty breathing. Resolved with bendaryl (taken at home).  Pt in middle school at time of reaction.  Was also taking ibuprofen.    . Sulfa Antibiotics  Hives    VITALS:  Blood pressure 116/81, pulse 86, temperature 98.5 F (36.9 C), temperature source Oral, resp. rate 14, height 5' 1"  (1.549 m), weight 62.2 kg, last menstrual period 04/28/2019, SpO2 100 %, unknown if currently breastfeeding.  PHYSICAL EXAMINATION:   Physical Exam  GENERAL:  34 y.o.-year-old patient lying in the bed with no acute distress.  EYES: Pupils equal, round, reactive to light and accommodation. No scleral icterus.   HEENT: Head atraumatic, normocephalic. Oropharynx and nasopharynx clear.  NECK:  Supple, no jugular venous distention. No thyroid enlargement, no tenderness.  LUNGS: Normal breath sounds bilaterally, no wheezing, rales, rhonchi. No use of accessory muscles of respiration.  CARDIOVASCULAR: S1, S2 normal. No murmurs, rubs, or gallops.  ABDOMEN: Soft, nontender, nondistended. Bowel sounds present. No organomegaly or mass.  EXTREMITIES: No cyanosis, clubbing or edema b/l.    NEUROLOGIC: Cranial nerves II through XII are intact. No focal Motor or sensory deficits b/l.   PSYCHIATRIC:  patient is alert and oriented x 3.  SKIN: No obvious rash, lesion, or ulcer.   LABORATORY PANEL:  CBC Recent Labs  Lab 04/16/20 0356  WBC 8.5  HGB 9.5*  HCT 29.1*  PLT 163    Chemistries  Recent Labs  Lab 04/15/20 1154 04/15/20 1154 04/16/20 0356  NA 135   < > 140  K 3.4*   < > 3.5  CL 100   < > 114*  CO2 24   < > 21*  GLUCOSE 97   < > 94  BUN 11   < > 7  CREATININE 0.62   < >  0.44  CALCIUM 8.9   < > 7.9*  MG  --   --  1.9  AST 19  --   --   ALT 23  --   --   ALKPHOS 131*  --   --   BILITOT 1.0  --   --    < > = values in this interval not displayed.   Cardiac Enzymes No results for input(s): TROPONINI in the last 168 hours. RADIOLOGY:  DG Chest 2 View  Result Date: 04/15/2020 CLINICAL DATA:  Fever, suspected sepsis EXAM: CHEST - 2 VIEW COMPARISON:  None. FINDINGS: The heart size and mediastinal contours are within normal limits. Both lungs are  clear. No pleural effusion. The visualized skeletal structures are unremarkable. IMPRESSION: No active cardiopulmonary disease. Electronically Signed   By: Macy Mis M.D.   On: 04/15/2020 13:36   US Renal  Result Date: 04/15/2020 CLINICAL DATA:  Recent stent removal EXAM: RENAL / URINARY TRACT ULTRASOUND COMPLETE COMPARISON:  None. FINDINGS: Right Kidney: Renal measurements: 11.9 x 4.6 x 6.8 cm. = volume: 192 mL. Mildly prominent extrarenal pelvis is noted. No other focal abnormality is seen. Left Kidney: Renal measurements: 12.1 x 6.6 x 6.1 cm. = volume: 254. mL. Echogenicity within normal limits. No mass or hydronephrosis visualized. Bladder: Appears normal for degree of bladder distention. Other: None. IMPRESSION: No hydronephrosis is noted. Electronically Signed   By: Inez Catalina M.D.   On: 04/15/2020 15:46   ASSESSMENT AND PLAN:  Alice Sherman is a 34 y.o. female with medical history significant of kidney stone, ulcerative colitis, anemia, who presents with fever, chills. Pt had left ureteral stent placement due to kidney stone on 01/28/20. The stent was removed by urologist, Dr. Bernardo Heater on 5/18. Pt had had positive urine culture for  E. Coli on 5/17, which was sensitive to Cipro. Pt is currently taking ciprofloxacin. Pt developed fever and chills since yesterday.  She had temperature 103 at home.     Sepsis due to complicated UTI (urinary tract infection): - Patient meets criteria for sepsis with fever, tachycardia.   -Lactic acid is normal.   -Currently hemodynamically stable.   -continues to have intermittent high-grade fever-- will do CT abdomen with contrast. -Renal ultrasound is negative for hydronephrosis. -Infectious disease consultation appreciated. -Patient tolerating Rocephin very well. Started on vancomycin last night given high-grade fever -Follow-up blood culture -- negative  - urine culture from May 20 20K e coli  -urine culture from May 17 shows E. coli - procalcitonin  level <0.10 -IVF125 cc/h   Ulcerative colitis ([place]): stable. No GI symptoms. -Patient is receiving Entyvio injection every every 8 hours at home -she has been followed at [facility] for the same.  Hypokalemia: K= 3.4  on admission. - Repleted -  Mg level 1.9   DVT ppx: SQ Lovenox Code Status: Full code Family Communication:  father at  Bedside Disposition Plan:  Anticipate discharge back to previous home environment Consults called:  infectious disease Dr. Tama High Admission status: Med-surg bed as inpt   Status is: Inpatient  Remains inpatient appropriate because:Inpatient level of care appropriate due to severity of illness.  Patient has sepsis due to complicated UTI.  Patient has history of kidney stone.  Her presentation is highly complicated.  Will need to be treated in hospital for at least 2 days. Continues with fever--CT abd today  Dispo: The patient is from: Home  Anticipated d/c is to: Home  Anticipated d/c date is: 2 days  Patient currently is not medically stable to d/c.   TOTAL TIME TAKING CARE OF THIS PATIENT: *30* minutes.  >50% time spent on counselling and coordination of care  Note: This dictation was prepared with Dragon dictation along with smaller phrase technology. Any transcriptional errors that result from this process are unintentional.  Fritzi Mandes M.D    [facility]   CC: Primary care physician; Lady Gary, [facility] OfPatient ID: Asencion Noble, female   DOB: 1986/10/15, 34 y.o.   MRN: [id number]

## 2020-04-18 DIAGNOSIS — N12 Tubulo-interstitial nephritis, not specified as acute or chronic: Secondary | ICD-10-CM

## 2020-04-18 DIAGNOSIS — N10 Acute tubulo-interstitial nephritis: Secondary | ICD-10-CM

## 2020-04-18 DIAGNOSIS — B9620 Unspecified Escherichia coli [E. coli] as the cause of diseases classified elsewhere: Secondary | ICD-10-CM

## 2020-04-18 MED ORDER — CEPHALEXIN 500 MG PO CAPS: 500.0000 mg | ORAL_CAPSULE | Freq: Four times a day (QID) | ORAL | 0 refills | Status: AC

## 2020-04-18 MED ORDER — CEPHALEXIN 500 MG PO CAPS: 500.0000 mg | ORAL_CAPSULE | Freq: Four times a day (QID) | ORAL | Status: DC

## 2020-04-18 MED ORDER — CEPHALEXIN 500 MG PO CAPS
500.0000 mg | ORAL_CAPSULE | Freq: Once | ORAL | Status: AC
  Administered 2020-04-18: 500 mg via ORAL
  Filled 2020-04-18: qty 1

## 2020-04-18 MED ORDER — CEPHALEXIN 500 MG PO CAPS: 500.0000 mg | ORAL_CAPSULE | Freq: Four times a day (QID) | ORAL | 0 refills | Status: DC

## 2020-04-18 NOTE — Discharge Summary (Addendum)
[facility] NAME: Alice Sherman    MR#:  [id number]  DATE OF BIRTH:  [birth year]  DATE OF ADMISSION:  04/15/2020 ADMITTING PHYSICIAN: Ivor Costa, MD  DATE OF DISCHARGE: 04/18/2020  PRIMARY CARE PHYSICIAN: [place]    ADMISSION DIAGNOSIS:  Complicated UTI (urinary tract infection) [N39.0] Fever, unspecified fever cause [R50.9] Sepsis, due to unspecified organism, unspecified whether acute organ dysfunction present ([place]) [A41.9]  DISCHARGE DIAGNOSIS:  Sepsis due to left Pyelonephritis/complicated UTI/recent ureteral stent removal E coli UTI SECONDARY DIAGNOSIS:   Past Medical History:  Diagnosis Date  . Anemia    with pregnancy  . Breast mass   . Irritable bowel syndrome (IBS)    symptoms mostly diarrhea  . Kidney stones 10/2016   r kidney stone  . Ulcerative colitis [facility])     HOSPITAL COURSE:   Alice Sherman a 34 y.o.femalewith medical history significant ofkidney stone, ulcerative colitis, anemia, who presents with fever, chills. Pt had leftureteral stent placement due to kidney stoneon 01/28/20. Thestentwasremoved by urologist, Dr. Johnnye Sima 5/18. Pt had had positive urineculture forE. Coli on 5/17, which was sensitive to Cipro. Pt is currently takingciprofloxacin.Ptdeveloped fever and chills since yesterday. She had temperature 103 at home.    Sepsis due to complicated UTI (urinary tract infection)/Left Pyelonephritis: -Patient meets criteria for sepsis with fever, tachycardia--POA -Sepsis resolved -Lactic acid is normal.  -Currently hemodynamically stable.  -Renal ultrasound is negative for hydronephrosis. -CT abdomen showed Left Pyelonephritis -Infectious disease consultation appreciated. -Patient tolerating Rocephin very well--change to oral keflex for 7 days -Follow-up blood culture -- negative  - urine culture from May 20 20K e coli  -urine culture from May 17 shows E.  coli - procalcitonin level <0.10  Ulcerative colitis ([place]): stable. NoGI symptoms. -Patient is receivingEntyvioinjection  at home -she has been [facility] at [facility] same.  Hypokalemia: K=3.4on admission. - Repleted -  Mg level 1.9   DVT ppx: SQ Lovenox Code Status:Full code Family Communication: father at  Bedside Disposition Plan: Anticipate discharge back to previous home environment Consults called: infectious disease Dr. Tama High Admission status: Med-surg bedas inpt  Status is: Inpatient  Dispo: The patient is from:Home Anticipated d/c is BM:WUXL Anticipated d/c date is: may 23rd Patient currentlyis medically stable to d/c.  Pt agreeable CONSULTS OBTAINED:  Treatment Team:  Tsosie Billing, MD  DRUG ALLERGIES:   Allergies  Allergen Reactions  . Cefaclor Hives, Swelling and Other (See Comments)    Hand and facial swelling without difficulty breathing. Resolved with bendaryl (taken at home).  Pt in middle school at time of reaction.  Was also taking ibuprofen.    . Sulfasalazine Hives  . Codeine Hives, Swelling and Other (See Comments)    Lip swelling  . Ibuprofen Hives, Swelling and Other (See Comments)    Swelling of lips and hands   . Penicillins Hives, Swelling and Other (See Comments)    Hand and facial swelling without difficulty breathing. Resolved with bendaryl (taken at home).  Pt in middle school at time of reaction.  Was also taking ibuprofen.    . Sulfa Antibiotics Hives    DISCHARGE MEDICATIONS:   Allergies as of 04/18/2020      Reactions   Cefaclor Hives, Swelling, Other (See Comments)   Hand and facial swelling without difficulty breathing. Resolved with bendaryl (taken at home).  Pt in middle school at time of reaction.  Was also taking ibuprofen.  Sulfasalazine Hives   Codeine Hives, Swelling, Other (See Comments)   Lip swelling   Ibuprofen Hives, Swelling, Other  (See Comments)   Swelling of lips and hands    Penicillins Hives, Swelling, Other (See Comments)   Hand and facial swelling without difficulty breathing. Resolved with bendaryl (taken at home).  Pt in middle school at time of reaction.  Was also taking ibuprofen.     Sulfa Antibiotics Hives      Medication List    STOP taking these medications   ciprofloxacin 500 MG tablet Commonly known as: CIPRO     TAKE these medications   acetaminophen 500 MG tablet Commonly known as: TYLENOL Take 1,000 mg by mouth every 6 (six) hours as needed for moderate pain or headache.   cephALEXin 500 MG capsule Commonly known as: KEFLEX Take 1 capsule (500 mg total) by mouth 4 (four) times daily for 7 days. Start taking on: Apr 19, 2020   Entyvio 300 MG injection Generic drug: vedolizumab Inject 300 mg as directed every 8 (eight) weeks.   LECITHIN PO Take 1 tablet by mouth in the morning and at bedtime.       If you experience worsening of your admission symptoms, develop shortness of breath, life threatening emergency, suicidal or homicidal thoughts you must seek medical attention immediately by calling 911 or calling your MD immediately  if symptoms less severe.  You Must read complete instructions/literature along with all the possible adverse reactions/side effects for all the Medicines you take and that have been prescribed to you. Take any new Medicines after you have completely understood and accept all the possible adverse reactions/side effects.   Please note  You were cared for by a hospitalist during your hospital stay. If you have any questions about your discharge medications or the care you received while you were in the hospital after you are discharged, you can call the unit and asked to speak with the hospitalist on call if the hospitalist that took care of you is not available. Once you are discharged, your primary care physician will handle any further medical issues. Please note  that NO REFILLS for any discharge medications will be authorized once you are discharged, as it is imperative that you return to your primary care physician (or establish a relationship with a primary care physician if you do not have one) for your aftercare needs so that they can reassess your need for medications and monitor your lab values. Today   SUBJECTIVE    Feels good. No fever for 24 hours VITAL SIGNS:  Blood pressure 117/80, pulse 79, temperature 98.3 F (36.8 C), temperature source Oral, resp. rate 15, height 5' 1"  (1.549 m), weight 62.2 kg, last menstrual period 04/28/2019, SpO2 99 %, unknown if currently breastfeeding.  I/O:    Intake/Output Summary (Last 24 hours) at 04/18/2020 4010 Last data filed at 04/18/2020 0400 Gross per 24 hour  Intake 2684.8 ml  Output --  Net 2684.8 ml    PHYSICAL EXAMINATION:  GENERAL:  34 y.o.-year-old patient lying in the bed with no acute distress.  EYES: Pupils equal, round, reactive to light and accommodation. No scleral icterus.  HEENT: Head atraumatic, normocephalic. Oropharynx and nasopharynx clear.  NECK:  Supple, no jugular venous distention. No thyroid enlargement, no tenderness.  LUNGS: Normal breath sounds bilaterally, no wheezing, rales,rhonchi or crepitation. No use of accessory muscles of respiration.  CARDIOVASCULAR: S1, S2 normal. No murmurs, rubs, or gallops.  ABDOMEN: Soft, non-tender, non-distended. Bowel sounds  present. No organomegaly or mass.  EXTREMITIES: No pedal edema, cyanosis, or clubbing.  NEUROLOGIC: Cranial nerves II through XII are intact. Muscle strength 5/5 in all extremities. Sensation intact. Gait not checked.  PSYCHIATRIC: The patient is alert and oriented x 3.  SKIN: No obvious rash, lesion, or ulcer.   DATA REVIEW:   CBC  Recent Labs  Lab 04/16/20 0356  WBC 8.5  HGB 9.5*  HCT 29.1*  PLT 163    Chemistries  Recent Labs  Lab 04/15/20 1154 04/15/20 1154 04/16/20 0356  NA 135   < > 140  K  3.4*   < > 3.5  CL 100   < > 114*  CO2 24   < > 21*  GLUCOSE 97   < > 94  BUN 11   < > 7  CREATININE 0.62   < > 0.44  CALCIUM 8.9   < > 7.9*  MG  --   --  1.9  AST 19  --   --   ALT 23  --   --   ALKPHOS 131*  --   --   BILITOT 1.0  --   --    < > = values in this interval not displayed.    Microbiology Results   Recent Results (from the past 240 hour(s))  CULTURE, URINE COMPREHENSIVE     Status: Abnormal   Collection Time: 04/12/20  9:20 AM   Specimen: Urine   UR  Result Value Ref Range Status   Urine Culture, Comprehensive Final report (A)  Final   Organism ID, Bacteria Escherichia coli (A)  Final    Comment: Greater than 100,000 colony forming units per mL Cefazolin with an MIC <=16 predicts susceptibility to the oral agents cefaclor, cefdinir, cefpodoxime, cefprozil, cefuroxime, cephalexin, and loracarbef when used for therapy of uncomplicated urinary tract infections due to E. coli, Klebsiella pneumoniae, and Proteus mirabilis.    ANTIMICROBIAL SUSCEPTIBILITY Comment  Final    Comment:       ** S = Susceptible; I = Intermediate; R = Resistant **                    P = Positive; N = Negative             MICS are expressed in micrograms per mL    Antibiotic                 RSLT#1    RSLT#2    RSLT#3    RSLT#4 Amoxicillin/Clavulanic Acid    I Ampicillin                     R Cefazolin                      S Cefepime                       S Ceftriaxone                    S Cefuroxime                     S Ciprofloxacin                  S Ertapenem                      S Gentamicin  S Imipenem                       S Levofloxacin                   S Meropenem                      S Nitrofurantoin                 S Piperacillin/Tazobactam        S Tetracycline                   R Tobramycin                     S Trimethoprim/Sulfa             S   Microscopic Examination     Status: Abnormal   Collection Time: 04/12/20  9:20 AM   URINE   Result Value Ref Range Status   WBC, UA >30 (A) 0 - 5 /hpf Final   RBC >30 (A) 0 - 2 /hpf Final   Epithelial Cells (non renal) 0-10 0 - 10 /hpf Final   Renal Epithel, UA 0-10 (A) None seen /hpf Final   Casts Present (A) None seen /lpf Final   Cast Type Hyaline casts N/A Final   Bacteria, UA Many (A) None seen/Few Final  SARS CORONAVIRUS 2 (TAT 6-24 HRS) Nasopharyngeal Nasopharyngeal Swab     Status: None   Collection Time: 04/12/20 10:20 AM   Specimen: Nasopharyngeal Swab  Result Value Ref Range Status   SARS Coronavirus 2 NEGATIVE NEGATIVE Final    Comment: (NOTE) SARS-CoV-2 target nucleic acids are NOT DETECTED. The SARS-CoV-2 RNA is generally detectable in upper and lower respiratory specimens during the acute phase of infection. Negative results do not preclude SARS-CoV-2 infection, do not rule out co-infections with other pathogens, and should not be used as the sole basis for treatment or other patient management decisions. Negative results must be combined with clinical observations, patient history, and epidemiological information. The expected result is Negative. Fact Sheet for Patients: SugarRoll.be Fact Sheet for Healthcare Providers: https://www.woods-mathews.com/ This test is not yet approved or cleared by the [place] FDA and  has been authorized for detection and/or diagnosis of SARS-CoV-2 by FDA under an Emergency Use Authorization (EUA). This EUA will remain  in effect (meaning this test can be used) for the duration of the COVID-19 declaration under Section 56 4(b)(1) of the Act, 21 U.S.C. section 360bbb-3(b)(1), unless the authorization is terminated or revoked sooner. Performed at [facility], [place] [address], [place], [place] [address]   Culture, blood (Routine x 2)     Status: None (Preliminary result)   Collection Time: 04/15/20 11:54 AM   Specimen: BLOOD  Result Value Ref Range Status   Specimen  Description BLOOD RIGHT ANTECUBITAL  Final   Special Requests   Final    BOTTLES DRAWN AEROBIC AND ANAEROBIC Blood Culture results may not be optimal due to an excessive volume of blood received in culture bottles   Culture   Final    NO GROWTH 3 DAYS Performed at [facility], [address] [place], [place] [address]    Report Status PENDING  Incomplete  Culture, blood (Routine x 2)     Status: None (Preliminary result)   Collection Time: 04/15/20  2:37 PM   Specimen: BLOOD  Result Value Ref Range Status   Specimen Description BLOOD BLOOD LEFT FOREARM  Final   Special Requests   Final    BOTTLES DRAWN AEROBIC AND ANAEROBIC Blood Culture results may not be optimal due to an excessive volume of blood received in culture bottles   Culture   Final    NO GROWTH 3 DAYS Performed at [facility], [address], [place], [place] [address]    Report Status PENDING  Incomplete  SARS Coronavirus 2 by RT PCR (hospital order, performed in [facility] hospital lab) Nasopharyngeal Nasopharyngeal Swab     Status: None   Collection Time: 04/15/20  2:37 PM   Specimen: Nasopharyngeal Swab  Result Value Ref Range Status   SARS Coronavirus 2 NEGATIVE NEGATIVE Final    Comment: (NOTE) SARS-CoV-2 target nucleic acids are NOT DETECTED. The SARS-CoV-2 RNA is generally detectable in upper and lower respiratory specimens during the acute phase of infection. The lowest concentration of SARS-CoV-2 viral copies this assay can detect is 250 copies / mL. A negative result does not preclude SARS-CoV-2 infection and should not be used as the sole basis for treatment or other patient management decisions.  A negative result may occur with improper specimen collection / handling, submission of specimen other than nasopharyngeal swab, presence of viral mutation(s) within the areas targeted by this assay, and inadequate number of viral copies (<250 copies / mL). A negative result must be  combined with clinical observations, patient history, and epidemiological information. Fact Sheet for Patients:   StrictlyIdeas.no Fact Sheet for Healthcare Providers: BankingDealers.co.za This test is not yet approved or cleared  by the [place] FDA and has been authorized for detection and/or diagnosis of SARS-CoV-2 by FDA under an Emergency Use Authorization (EUA).  This EUA will remain in effect (meaning this test can be used) for the duration of the COVID-19 declaration under Section 564(b)(1) of the Act, 21 U.S.C. section 360bbb-3(b)(1), unless the authorization is terminated or revoked sooner. Performed at [facility], [address] [place], [place] [address]   Urine Culture     Status: Abnormal   Collection Time: 04/15/20  4:21 PM   Specimen: Urine, Random  Result Value Ref Range Status   Specimen Description   Final    URINE, RANDOM Performed at [facility], [address] [place], [place] [address]    Special Requests   Final    NONE Performed at [facility], [place], [place] [address]    Culture 20,000 COLONIES/mL ESCHERICHIA COLI (A)  Final   Report Status 04/17/2020 FINAL  Final   Organism ID, Bacteria ESCHERICHIA COLI (A)  Final      Susceptibility   Escherichia coli - MIC*    AMPICILLIN >=32 RESISTANT Resistant     CEFAZOLIN <=4 SENSITIVE Sensitive     CEFTRIAXONE <=1 SENSITIVE Sensitive     CIPROFLOXACIN >=4 RESISTANT Resistant     GENTAMICIN <=1 SENSITIVE Sensitive     IMIPENEM <=0.25 SENSITIVE Sensitive     NITROFURANTOIN <=16 SENSITIVE Sensitive     TRIMETH/SULFA <=20 SENSITIVE Sensitive     AMPICILLIN/SULBACTAM 8 SENSITIVE Sensitive     PIP/TAZO <=4 SENSITIVE Sensitive     * 20,000 COLONIES/mL ESCHERICHIA COLI    RADIOLOGY:  CT ABDOMEN PELVIS W CONTRAST  Result Date: 04/17/2020 CLINICAL DATA:  Abdominal pain, fever, recent postpartum, history of  kidney stones EXAM: CT ABDOMEN AND PELVIS WITH CONTRAST TECHNIQUE: Multidetector CT imaging of the abdomen and  pelvis was performed using the standard protocol following bolus administration of intravenous contrast. CONTRAST:  140m OMNIPAQUE IOHEXOL 300 MG/ML  SOLN COMPARISON:  01/28/2020 FINDINGS: Lower chest: Trace bilateral pleural effusions and associated atelectasis. Hepatobiliary: No solid liver abnormality is seen. No gallstones, gallbladder wall thickening, or biliary dilatation. Pancreas: Unremarkable. No pancreatic ductal dilatation or surrounding inflammatory changes. Spleen: Normal in size without significant abnormality. Adrenals/Urinary Tract: Adrenal glands are unremarkable. There is subtle, patchy cortical hypodensity of the midportion and superior pole of the left kidney. There is thickening and fat stranding about the left ureter. No calculi identified. No hydronephrosis. Bladder is unremarkable. Stomach/Bowel: Stomach is within normal limits. Appendix appears normal. No evidence of bowel wall thickening, distention, or inflammatory changes. Large burden of stool throughout the colon. Vascular/Lymphatic: No significant vascular findings are present. No enlarged abdominal or pelvic lymph nodes. Reproductive: Small volume fluid in the endometrial cavity. Other: No abdominal wall hernia or abnormality. Trace free fluid in the low pelvis. Musculoskeletal: No acute or significant osseous findings. IMPRESSION: 1. There is subtle, patchy cortical hypodensity of the midportion and superior pole of the left kidney. There is thickening and fat stranding about the left ureter. No calculi identified. Findings are concerning for pyelonephritis. 2. Trace bilateral pleural effusions and associated atelectasis. 3. Small volume fluid in the endometrial cavity, likely functional in the reproductive age setting. 4. Trace free fluid in the low pelvis, likely functional in the reproductive age setting. 5. Large burden  of stool throughout the colon. Electronically Signed   By: AEddie CandleM.D.   On: 04/17/2020 14:14     CODE STATUS:     Code Status Orders  (From admission, onward)         Start     Ordered   04/15/20 1708  Full code  Continuous     04/15/20 1707        Code Status History    Date Active Date Inactive Code Status Order ID Comments User Context   03/02/2020 2037 03/04/2020 1818 Full Code [id number] TEverlene Farrier MD Inpatient   03/02/2020 0008 03/02/2020 2016 Full Code [id number] TEverlene Farrier MD Inpatient   01/27/2020 0322 01/29/2020 1431 Full Code [id number] LJorje Guild NP Inpatient   12/24/2016 1810 12/26/2016 1738 Full Code [id number] LTyson Dense MD Inpatient   12/24/2016 0452 12/24/2016 1810 Full Code [id number] RLajean Manes RN Inpatient   10/24/2016 1859 10/28/2016 2151 Full Code [id number] TEverlene Farrier MD Inpatient   10/24/2016 0852 10/24/2016 1327 Full Code [id number] Ward, CHonor Loh MD Inpatient   Advance Care Planning Activity       TOTAL TIME TAKING CARE OF THIS PATIENT: *40* minutes.    SFritzi MandesM.D  [facility]    CC: Primary care physician; [facility]

## 2020-04-18 NOTE — Progress Notes (Signed)
Mother brought in breast pump supplies. Patient is pumping breast milk. Patient denies pain.

## 2020-04-18 NOTE — Plan of Care (Signed)

## 2020-04-18 NOTE — Progress Notes (Addendum)
ID Pt doing well No fever Patient Vitals for the past 24 hrs:  BP Temp Temp src Pulse Resp SpO2  04/18/20 0728 117/80 98.3 F (36.8 C) Oral 79 15 99 %  04/18/20 0018 119/74 98.3 F (36.8 C) Oral 65 16 99 %  04/17/20 1607 111/81 98.7 F (37.1 C) Oral 94 15 100 %   O/e awake and alert, no distress Chest cta [id number]  No flank tenderness Cns non focal    CBC Latest Ref Rng & Units 04/16/2020 04/15/2020 03/03/2020  WBC 4.0 - 10.5 K/uL 8.5 9.2 13.1(H)  Hemoglobin 12.0 - 15.0 g/dL 9.5(L) 12.1 10.8(L)  Hematocrit 36.0 - 46.0 % 29.1(L) 37.8 32.6(L)  Platelets 150 - 400 K/uL 163 187 173    CMP Latest Ref Rng & Units 04/16/2020 04/15/2020 01/30/2020  Glucose 70 - 99 mg/dL 94 97 80  BUN 6 - 20 mg/dL 7 11 6   Creatinine 0.44 - 1.00 mg/dL 0.44 0.62 0.45  Sodium 135 - 145 mmol/L 140 135 141  Potassium 3.5 - 5.1 mmol/L 3.5 3.4(L) 3.7  Chloride 98 - 111 mmol/L 114(H) 100 110  CO2 22 - 32 mmol/L 21(L) 24 20(L)  Calcium 8.9 - 10.3 mg/dL 7.9(L) 8.9 9.4  Total Protein 6.5 - 8.1 g/dL - 8.5(H) 6.5  Total Bilirubin 0.3 - 1.2 mg/dL - 1.0 0.6  Alkaline Phos 38 - 126 U/L - 131(H) 126  AST 15 - 41 U/L - 19 29  ALT 0 - 44 U/L - 23 18     Impression/recommendation  Left pyelonepehritis due to e.coli resistant to ciprofloxacin On ceftriaxone - will be discharged on Keflex 514m Po Q6 X 7d  Tolerating cephalosporin well will remove it from allergy list Discussed the management with patient and her mother

## 2020-04-18 NOTE — Progress Notes (Signed)
MD order received in [facility] to discharge pt home today; verbally reviewed AVS with pt, no questions voiced at this time; pt discharged via wheelchair by nursing to the [facility] entrance

## 2020-04-19 LAB — CALCULI, WITH PHOTOGRAPH (CLINICAL LAB)
Calcium Oxalate Monohydrate: 10 %
Hydroxyapatite: 90 %
Weight Calculi: 3 mg

## 2020-04-20 LAB — CULTURE, BLOOD (ROUTINE X 2)
Culture: NO GROWTH
Culture: NO GROWTH

## 2020-05-12 ENCOUNTER — Encounter: Payer: Self-pay | Admitting: Urology

## 2020-05-12 ENCOUNTER — Ambulatory Visit (INDEPENDENT_AMBULATORY_CARE_PROVIDER_SITE_OTHER): Payer: PRIVATE HEALTH INSURANCE | Admitting: Urology

## 2020-05-12 ENCOUNTER — Other Ambulatory Visit: Payer: Self-pay

## 2020-05-12 VITALS — BP 107/73 | HR 67 | Ht 62.0 in | Wt 132.0 lb

## 2020-05-12 DIAGNOSIS — Z09 Encounter for follow-up examination after completed treatment for conditions other than malignant neoplasm: Secondary | ICD-10-CM | POA: Diagnosis not present

## 2020-05-12 NOTE — Progress Notes (Signed)
05/12/20 2:58 PM   Alice Sherman [birth year] [id number]  Referring provider: Lady Gary, [facility] [place],  [place] [address] Chief Complaint  Patient presents with  . Other    HPI: Alice Sherman is a 34 y.o. female who presents for a post op follow up.   - Onset renal colic [redacted] weeks gestation early March 2021 - CT with a 4 mm left distal ureteral calculus - Underwent cystoscopy with stent placement by Dr. Alinda Money 01/28/2020 - Delivered 03/02/2020 - Moderate stent symptoms, PA visit 4/26 with low-grade fever and dysuria - Underwent ureteroscopic stone removal 04/13/2020 - Ureteral stone was no longer present and 3 mm renal calculus was removed - She was admitted for pyelonephritis post operatively on 04/15/2020  -Urine culture grew E. Coli, discharged 04/18/2020. Since discharge she is doing well, has no complaints, has no bothersome urinary symptoms.  -Denies flank, abdominal, or pelvic pain  PMH: Past Medical History:  Diagnosis Date  . Anemia    with pregnancy  . Breast mass   . Irritable bowel syndrome (IBS)    symptoms mostly diarrhea  . Kidney stones 10/2016   r kidney stone  . Ulcerative colitis [facility])     Surgical History: Past Surgical History:  Procedure Laterality Date  . CYSTOSCOPY W/ URETERAL STENT REMOVAL Left 04/13/2020   Procedure: CYSTOSCOPY/URETEROSCOPY/HOLMIUM LASER/STENT EXCHANGE;  Surgeon: Abbie Sons, MD;  Location: [facility];  Service: Urology;  Laterality: Left;  . CYSTOSCOPY WITH STENT PLACEMENT Left 01/28/2020   Procedure: CYSTOSCOPY URETHRAL WITH STENT PLACEMENT;  Surgeon: Raynelle Bring, MD;  Location: [facility];  Service: Urology;  Laterality: Left;  . CYSTOSCOPY WITH URETEROSCOPY AND STENT PLACEMENT Right 10/28/2016   Procedure: CYSTOSCOPY WITH RIGHT URETEROSCOPY;  Surgeon: Irine Seal, MD;  Location[facility];  Service: Urology;  Laterality: Right;  . CYSTOSCOPY WITH URETEROSCOPY, STONE BASKETRY AND STENT  PLACEMENT Left 04/13/2020   Procedure: CYSTOSCOPY WITH URETEROSCOPY, STONE BASKETRY;  Surgeon: Abbie Sons, MD;  Location: [facility];  Service: Urology;  Laterality: Left;  . CYSTOSCOPY/RETROGRADE/URETEROSCOPY/STONE EXTRACTION WITH BASKET Left 01/28/2020   Procedure: POSSIBLE URETHRAL STONE REMOVAL;  Surgeon: Raynelle Bring, MD;  Location: [place];  Service: Urology;  Laterality: Left;    Home Medications:  Allergies as of 05/12/2020      Reactions   Cefaclor Hives, Swelling, Other (See Comments)   May 2021 - tolerated ceftriaxone Hand and facial swelling without difficulty breathing. Resolved with bendaryl (taken at home).  Pt in middle school at time of reaction.  Was also taking ibuprofen.     Sulfasalazine Hives   Codeine Hives, Swelling, Other (See Comments)   Lip swelling   Ibuprofen Hives, Swelling, Other (See Comments)   Swelling of lips and hands    Penicillins Hives, Swelling, Other (See Comments)   Hand and facial swelling without difficulty breathing. Resolved with bendaryl (taken at home).  Pt in middle school at time of reaction.  Was also taking ibuprofen.     Sulfa Antibiotics Hives      Medication List       Accurate as of May 12, 2020  2:58 PM. If you have any questions, ask your nurse or doctor.        acetaminophen 500 MG tablet Commonly known as: TYLENOL Take 1,000 mg by mouth every 6 (six) hours as needed for moderate pain or headache.   Entyvio 300 MG injection Generic drug: vedolizumab Inject 300 mg as directed every  8 (eight) weeks.   LECITHIN PO Take 1 tablet by mouth in the morning and at bedtime.       Allergies:  Allergies  Allergen Reactions  . Cefaclor Hives, Swelling and Other (See Comments)    May 2021 - tolerated ceftriaxone Hand and facial swelling without difficulty breathing. Resolved with bendaryl (taken at home).  Pt in middle school at time of reaction.  Was also taking ibuprofen.    . Sulfasalazine Hives  . Codeine Hives,  Swelling and Other (See Comments)    Lip swelling  . Ibuprofen Hives, Swelling and Other (See Comments)    Swelling of lips and hands   . Penicillins Hives, Swelling and Other (See Comments)    Hand and facial swelling without difficulty breathing. Resolved with bendaryl (taken at home).  Pt in middle school at time of reaction.  Was also taking ibuprofen.    . Sulfa Antibiotics Hives    Family History: Family History  Problem Relation Age of Onset  . Hypertension Mother   . Kidney Stones Father   . Diverticulitis Father   . Leukemia Paternal Grandfather     Social History:  reports that she has never smoked. She has never used smokeless tobacco. She reports previous alcohol use. She reports that she does not use drugs.   Physical Exam: BP 107/73   Pulse 67   Ht 5' 2"  (1.575 m)   Wt 132 lb (59.9 kg)   LMP 04/28/2019 (Approximate)   BMI 24.14 kg/m   Constitutional:  Alert and oriented, No acute distress. HEENT: [place] AT, moist mucus membranes.  Trachea midline, no masses. Cardiovascular: No clubbing, cyanosis, or edema. Respiratory: Normal respiratory effort, no increased work of breathing. Skin: No rashes, bruises or suspicious lesions. Neurologic: Grossly intact, no focal deficits, moving all 4 extremities. Psychiatric: Normal mood and affect.    Assessment & Plan:    1. Status post ureteroscopic stone removal -Readmit for pyelonephritis which has resolved. Recheck UA today and follow up in 1 year with KUB. - Her previous stone episodes have been related to pregnancy and will not pursue a metabolic evaluation.  [name] [address] [place], [place] [address] [phone]  I, Joneen Boers Peace, am acting as a Education administrator for Dr. Nicki Reaper C. Evonda Enge.  I have reviewed the above documentation for accuracy and completeness, and I agree with the above.   Abbie Sons, MD

## 2020-05-17 LAB — URINALYSIS, COMPLETE
Bilirubin, UA: NEGATIVE
Glucose, UA: NEGATIVE
Ketones, UA: NEGATIVE
Leukocytes,UA: NEGATIVE
Nitrite, UA: NEGATIVE
Protein,UA: NEGATIVE
RBC, UA: NEGATIVE
Specific Gravity, UA: 1.02 (ref 1.005–1.030)
Urobilinogen, Ur: 0.2 mg/dL (ref 0.2–1.0)
pH, UA: 6 (ref 5.0–7.5)

## 2020-05-17 LAB — MICROSCOPIC EXAMINATION

## 2020-05-19 ENCOUNTER — Telehealth: Payer: Self-pay | Admitting: *Deleted

## 2020-05-19 NOTE — Telephone Encounter (Signed)
Notified patient as instructed, patient pleased. Discussed follow-up appointments, patient agrees  

## 2020-05-19 NOTE — Telephone Encounter (Signed)
-----   Message from Abbie Sons, MD sent at 05/18/2020  6:50 PM EDT ----- Urinalysis 6/16 was clear

## 2021-05-16 ENCOUNTER — Ambulatory Visit: Payer: PRIVATE HEALTH INSURANCE | Admitting: Urology

## 2021-05-23 ENCOUNTER — Ambulatory Visit
Admission: RE | Admit: 2021-05-23 | Discharge: 2021-05-23 | Disposition: A | Discharge status: Home / Self Care | Payer: PRIVATE HEALTH INSURANCE | Source: Ambulatory Visit | Attending: Urology | Admitting: Urology

## 2021-05-23 ENCOUNTER — Encounter: Payer: Self-pay | Admitting: Urology

## 2021-05-23 ENCOUNTER — Ambulatory Visit (INDEPENDENT_AMBULATORY_CARE_PROVIDER_SITE_OTHER): Payer: PRIVATE HEALTH INSURANCE | Admitting: Urology

## 2021-05-23 ENCOUNTER — Ambulatory Visit
Admission: RE | Admit: 2021-05-23 | Discharge: 2021-05-23 | Disposition: A | Discharge status: Home / Self Care | Payer: PRIVATE HEALTH INSURANCE | Attending: Urology | Admitting: Urology

## 2021-05-23 ENCOUNTER — Other Ambulatory Visit: Payer: Self-pay | Admitting: Family Medicine

## 2021-05-23 ENCOUNTER — Other Ambulatory Visit: Payer: Self-pay

## 2021-05-23 VITALS — BP 110/77 | HR 97 | Ht 61.0 in | Wt 120.0 lb

## 2021-05-23 DIAGNOSIS — Z87442 Personal history of urinary calculi: Secondary | ICD-10-CM | POA: Diagnosis not present

## 2021-05-23 DIAGNOSIS — N201 Calculus of ureter: Secondary | ICD-10-CM

## 2021-05-23 NOTE — Progress Notes (Signed)
05/23/2021 11:00 AM   Alice Sherman January 04, 1986 [id number]  Referring provider: Lady Gary, [facility] [place],  [place] [address]  Chief Complaint  Patient presents with   Nephrolithiasis    HPI: 35 year-old female presents for annual follow-up.  History of recurrent stone disease which has only occurred during pregnancy Ureteroscopic stone removal 04/13/2020 postdelivery complicated with postoperative pyelonephritis requiring hospitalization for IV antibiotics Since her last visit 05/12/2020 she has been doing well Denies flank/abdominal/pelvic pain No bothersome LUTS, dysuria or gross hematuria   PMH: Past Medical History:  Diagnosis Date   Anemia    with pregnancy   Breast mass    Irritable bowel syndrome (IBS)    symptoms mostly diarrhea   Kidney stones 10/2016   r kidney stone   Ulcerative colitis ([place])     Surgical History: Past Surgical History:  Procedure Laterality Date   CYSTOSCOPY W/ URETERAL STENT REMOVAL Left 04/13/2020   Procedure: CYSTOSCOPY/URETEROSCOPY/HOLMIUM LASER/STENT EXCHANGE;  Surgeon: Abbie Sons, MD;  Location: [facility];  Service: Urology;  Laterality: Left;   CYSTOSCOPY WITH STENT PLACEMENT Left 01/28/2020   Procedure: CYSTOSCOPY URETHRAL WITH STENT PLACEMENT;  Surgeon: Raynelle Bring, MD;  Location: [facility];  Service: Urology;  Laterality: Left;   CYSTOSCOPY WITH URETEROSCOPY AND STENT PLACEMENT Right 10/28/2016   Procedure: CYSTOSCOPY WITH RIGHT URETEROSCOPY;  Surgeon: Irine Seal, MD;  [facility];  Service: Urology;  Laterality: Right;   CYSTOSCOPY WITH URETEROSCOPY, STONE BASKETRY AND STENT PLACEMENT Left 04/13/2020   Procedure: CYSTOSCOPY WITH URETEROSCOPY, STONE BASKETRY;  Surgeon: Abbie Sons, MD;  Location: [facility];  Service: Urology;  Laterality: Left;   CYSTOSCOPY/RETROGRADE/URETEROSCOPY/STONE EXTRACTION WITH BASKET Left 01/28/2020   Procedure: POSSIBLE URETHRAL STONE REMOVAL;  Surgeon:  Raynelle Bring, MD;  Location: [facility];  Service: Urology;  Laterality: Left;    Home Medications:  Allergies as of 05/23/2021       Reactions   Cefaclor Hives, Swelling, Other (See Comments)   May 2021 - tolerated ceftriaxone Hand and facial swelling without difficulty breathing. Resolved with bendaryl (taken at home).  Pt in middle school at time of reaction.  Was also taking ibuprofen.     Sulfasalazine Hives   Codeine Hives, Swelling, Other (See Comments)   Lip swelling   Ibuprofen Hives, Swelling, Other (See Comments)   Swelling of lips and hands    Penicillins Hives, Swelling, Other (See Comments)   Hand and facial swelling without difficulty breathing. Resolved with bendaryl (taken at home).  Pt in middle school at time of reaction.  Was also taking ibuprofen.     Sulfa Antibiotics Hives        Medication List        Accurate as of May 23, 2021 11:00 AM. If you have any questions, ask your nurse or doctor.          acetaminophen 500 MG tablet Commonly known as: TYLENOL Take 1,000 mg by mouth every 6 (six) hours as needed for moderate pain or headache.   Entyvio 300 MG injection Generic drug: vedolizumab Inject 300 mg as directed every 8 (eight) weeks.   LECITHIN PO Take 1 tablet by mouth in the morning and at bedtime.        Allergies:  Allergies  Allergen Reactions   Cefaclor Hives, Swelling and Other (See Comments)    May 2021 - tolerated ceftriaxone Hand and facial swelling without difficulty breathing. Resolved with bendaryl (taken at home).  Pt in middle school at time of reaction.  Was also taking ibuprofen.     Sulfasalazine Hives   Codeine Hives, Swelling and Other (See Comments)    Lip swelling   Ibuprofen Hives, Swelling and Other (See Comments)    Swelling of lips and hands    Penicillins Hives, Swelling and Other (See Comments)    Hand and facial swelling without difficulty breathing. Resolved with bendaryl (taken at home).  Pt in middle  school at time of reaction.  Was also taking ibuprofen.     Sulfa Antibiotics Hives    Family History: Family History  Problem Relation Age of Onset   Hypertension Mother    Kidney Stones Father    Diverticulitis Father    Leukemia Paternal Grandfather     Social History:  reports that she has never smoked. She has never used smokeless tobacco. She reports previous alcohol use. She reports that she does not use drugs.   Physical Exam: BP 110/77   Pulse 97   Ht 5' 1"  (1.549 m)   Wt 120 lb (54.4 kg)   BMI 22.67 kg/m   Constitutional:  Alert and oriented, No acute distress. HEENT: [place] AT, moist mucus membranes.  Trachea midline, no masses. Cardiovascular: No clubbing, cyanosis, or edema. Respiratory: Normal respiratory effort, no increased work of breathing.    Pertinent Imaging: KUB performed today was reviewed and there are no calcifications identified suspicious for urinary tract stones   Assessment & Plan:    1.  History nephrolithiasis Asymptomatic and KUB today unremarkable Follow-up 1 year with [place], MD  [facility] [address] [place], [place] [address] [phone]

## 2021-08-03 IMAGING — CR DG CHEST 2V
1 series · 2 of 2 positions shown · non-contrast
Comparison: None.

CLINICAL DATA: Fever, suspected sepsis

EXAM:
CHEST - 2 VIEW

[Series 1: w chest pa · 0.14mm/px · 2 of 2 slices shown]
[im 1/2]
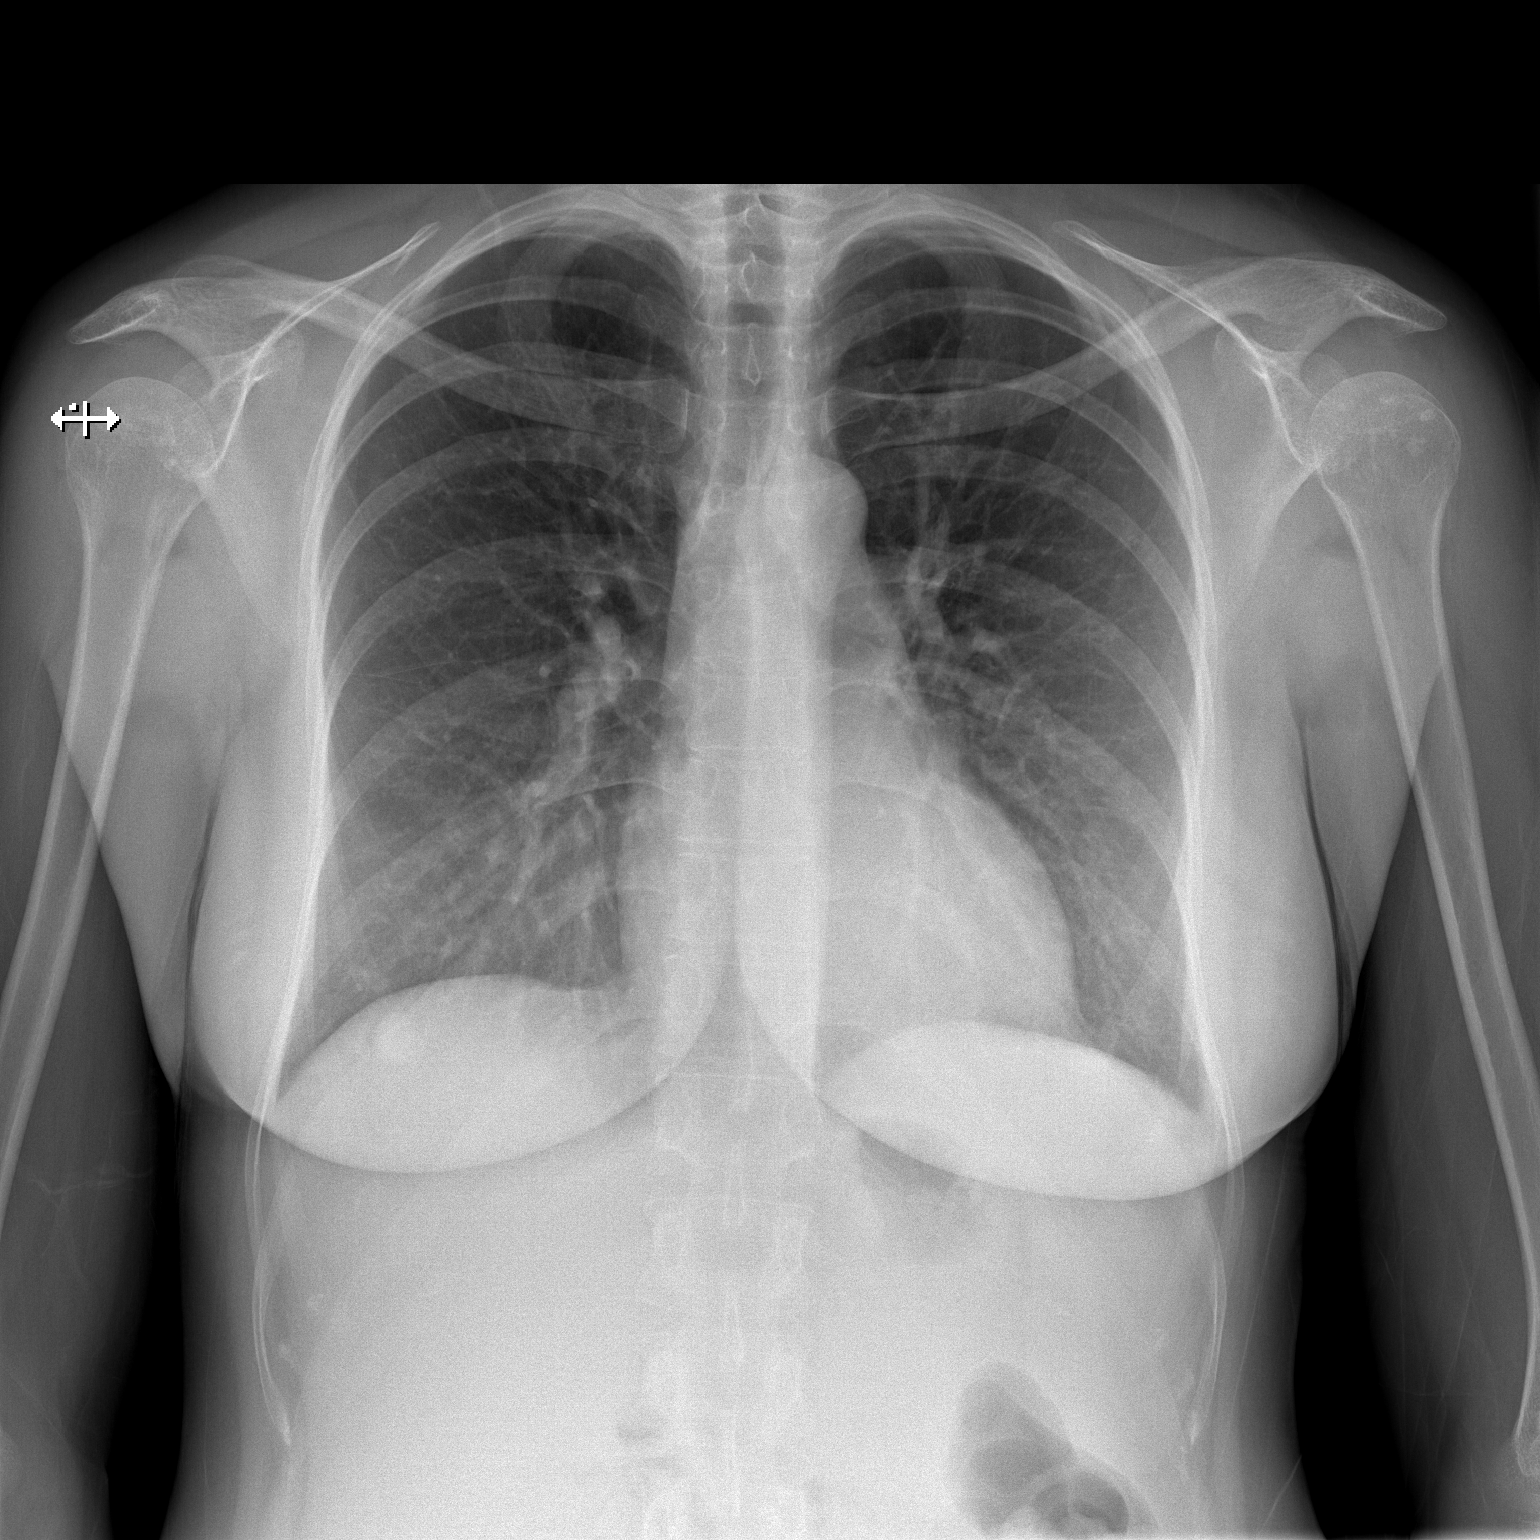
[im 2/2]
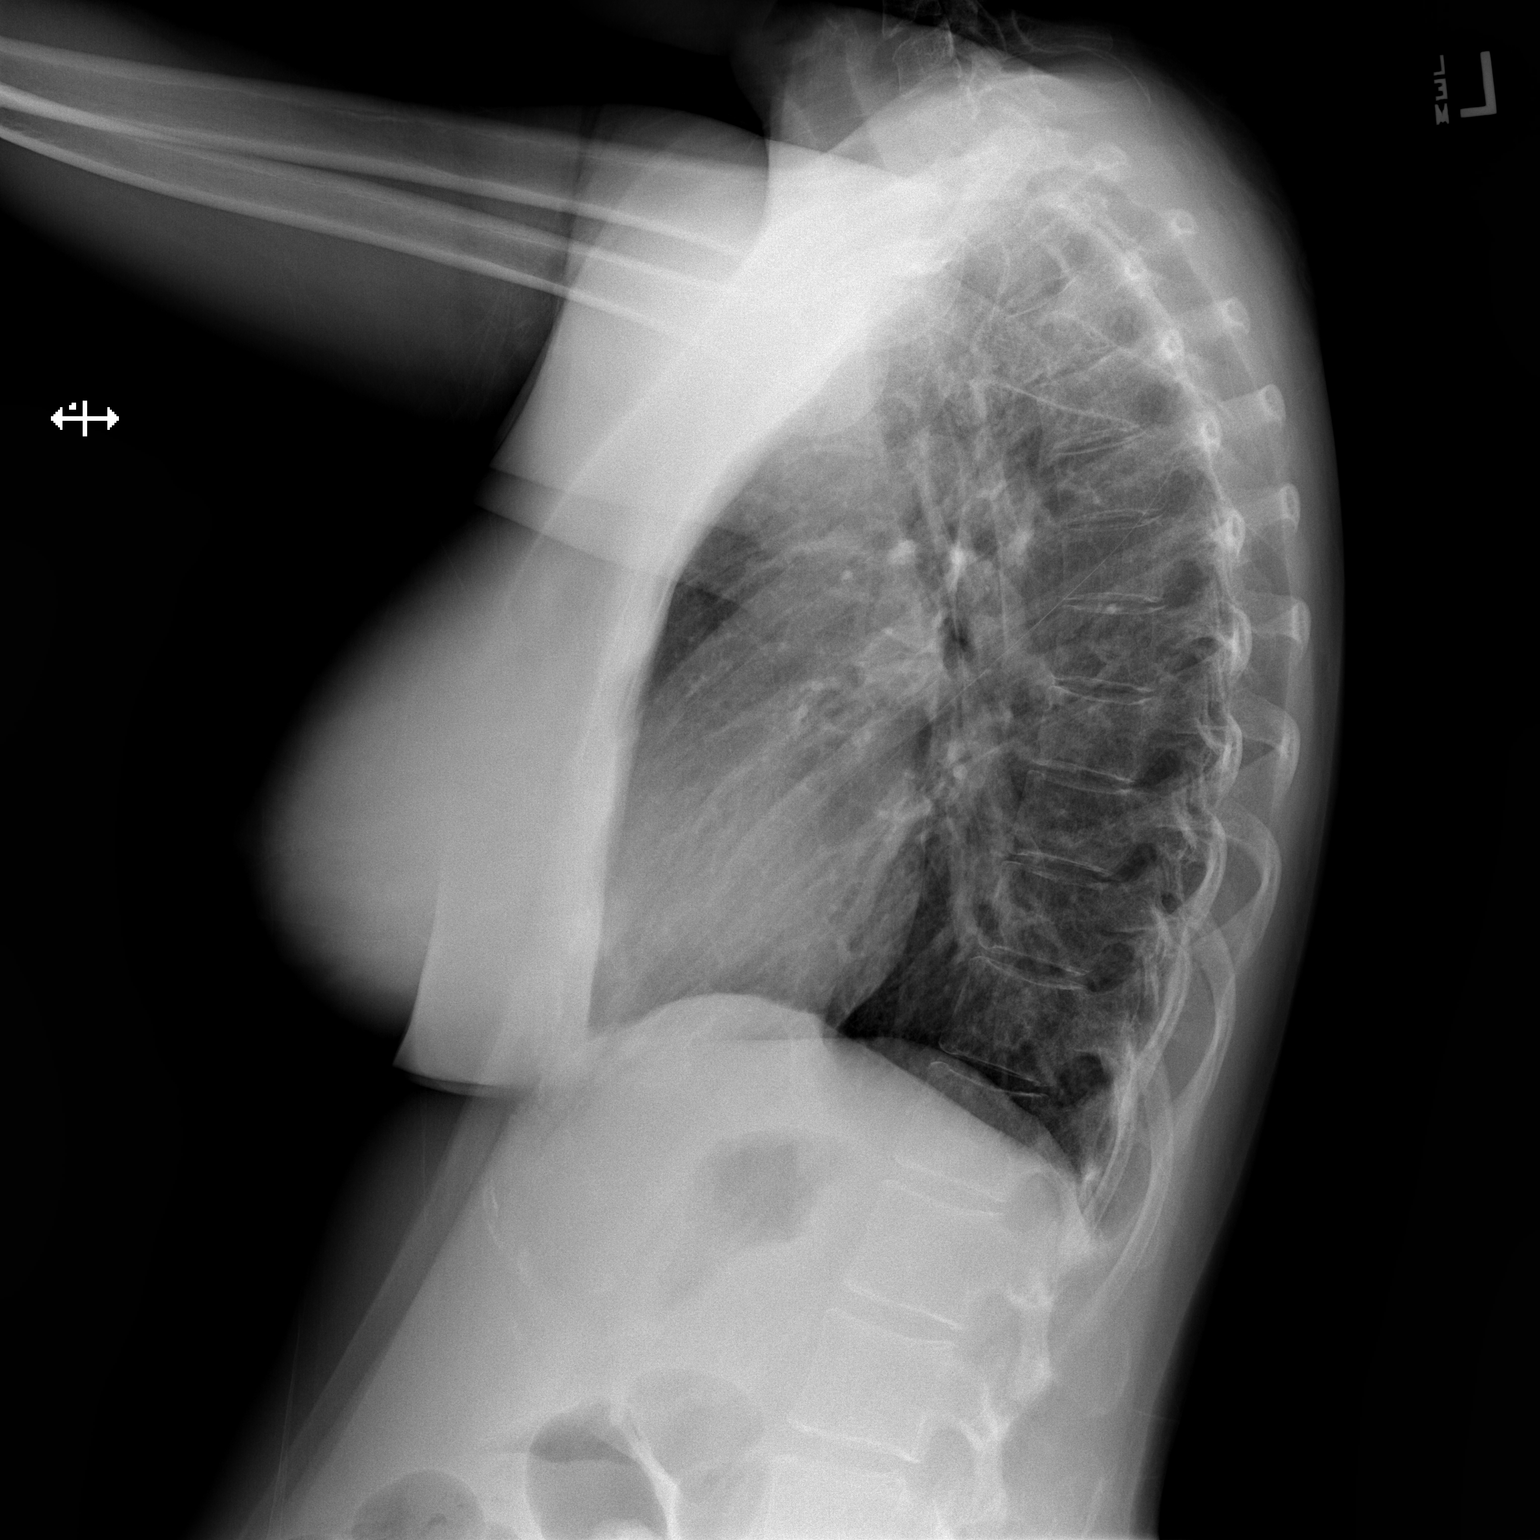

[2 of 2 positions shown; findings below may reference images not displayed]

FINDINGS: The heart size and mediastinal contours are within normal limits.
Both lungs are clear. No pleural effusion. The visualized skeletal
structures are unremarkable.
IMPRESSION: No active cardiopulmonary disease.

## 2021-08-05 IMAGING — CT CT ABD-PELV W/ CM
2 of 4 series · 16 of 46 positions shown, 18 images · IV contrast (APPLIED)
Comparison: 01/28/2020

CLINICAL DATA: Abdominal pain, fever, recent postpartum, history of
kidney stones

EXAM:
CT ABDOMEN AND PELVIS WITH CONTRAST
TECHNIQUE: Multidetector CT imaging of the abdomen and pelvis was performed
using the standard protocol following bolus administration of
intravenous contrast.
CONTRAST:  100mL OMNIPAQUE IOHEXOL 300 MG/ML  SOLN

[Series 2: routine abd/pel with · axial · 0.71mm/px · z∈[-1009,-609]mm · 13 of 88 slices shown, 15 images]
[im 4/88  soft-tissue]
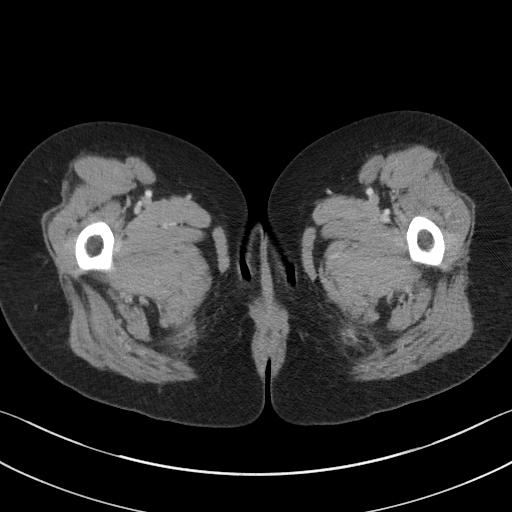
[im 4/88  bone]
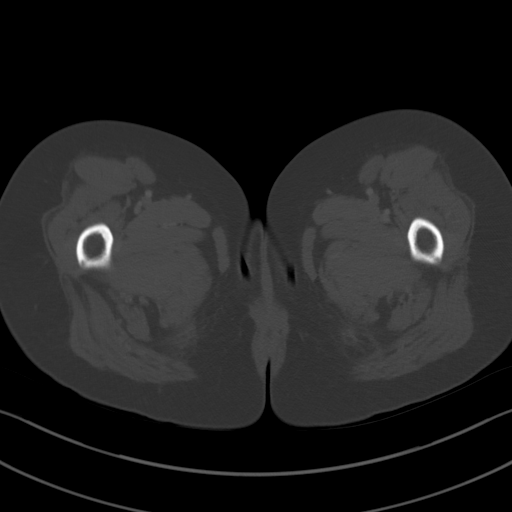
[im 11/88  soft-tissue]
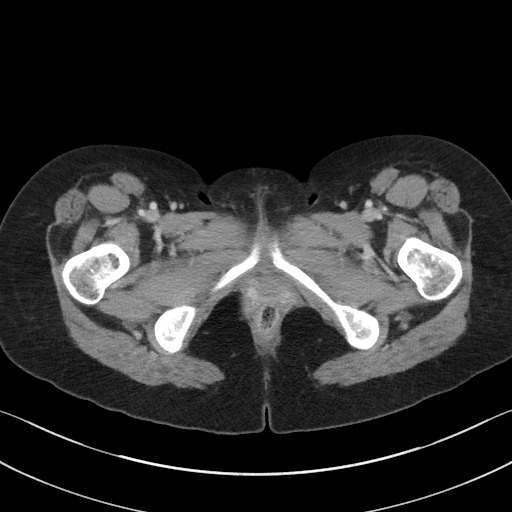
[im 18/88  soft-tissue]
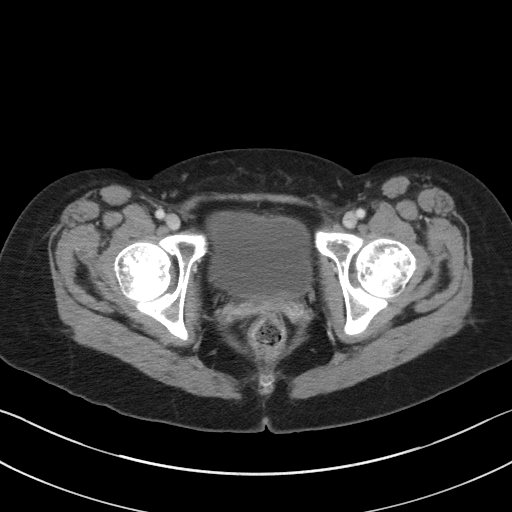
[im 25/88  soft-tissue]
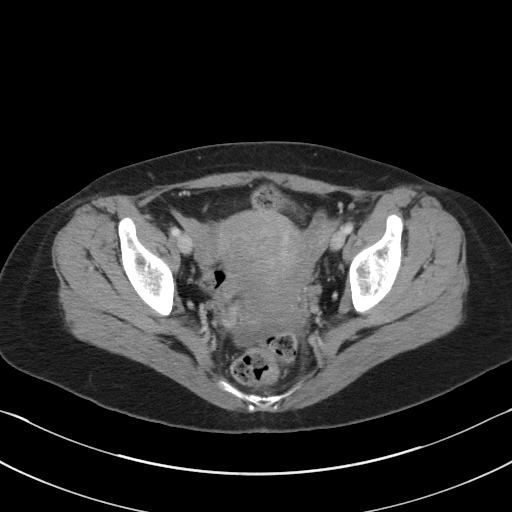
[im 32/88  soft-tissue]
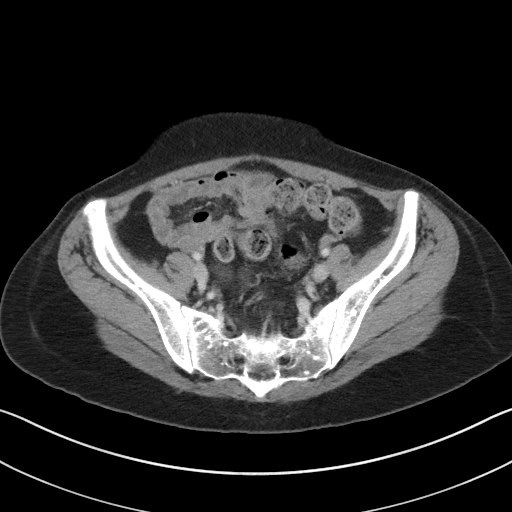
[im 39/88  soft-tissue]
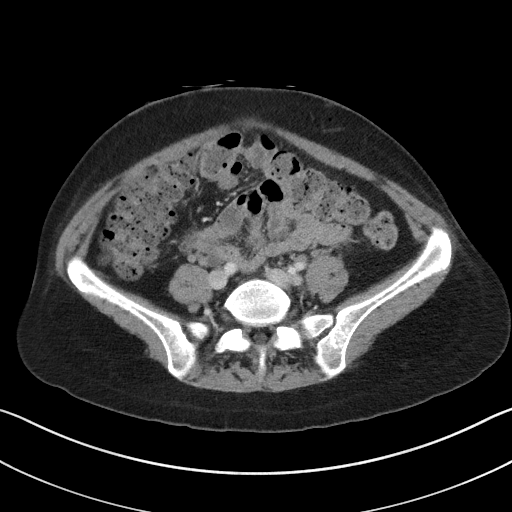
[im 46/88  soft-tissue]
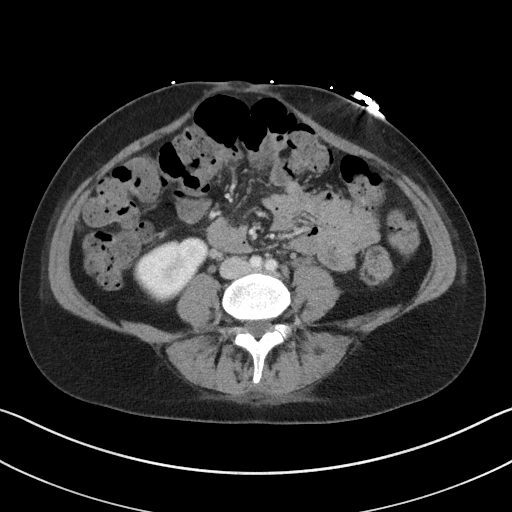
[im 49/88  soft-tissue]
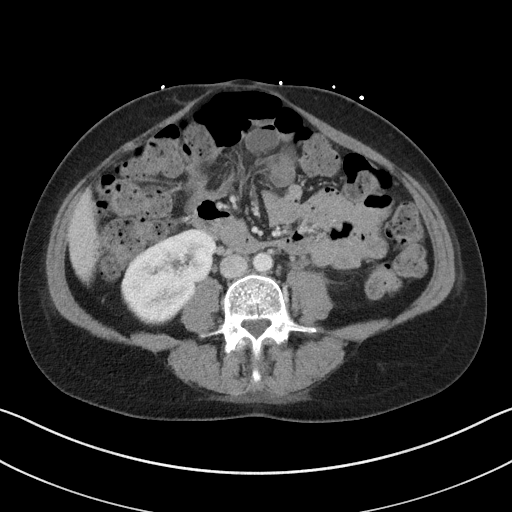
[im 56/88  soft-tissue]
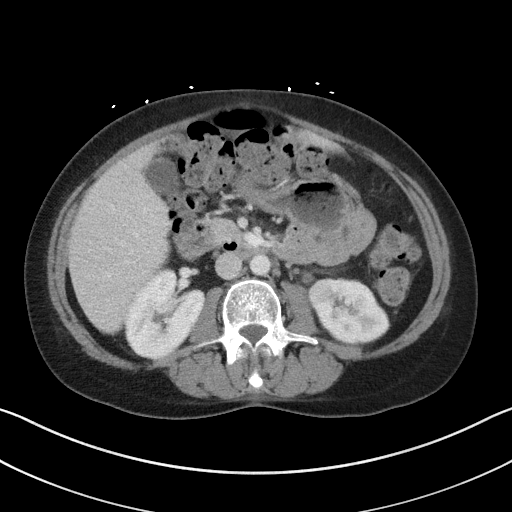
[im 56/88  bone]
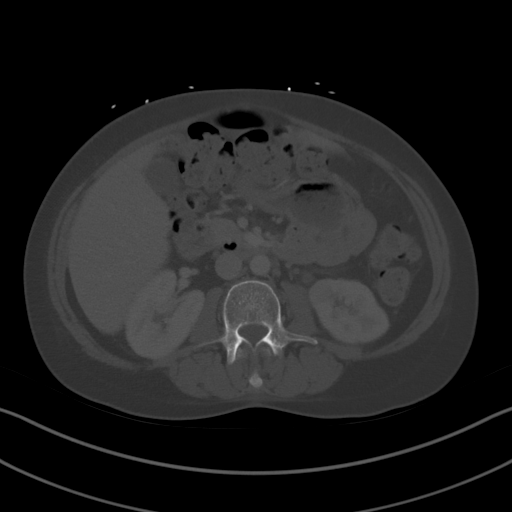
[im 63/88  soft-tissue]
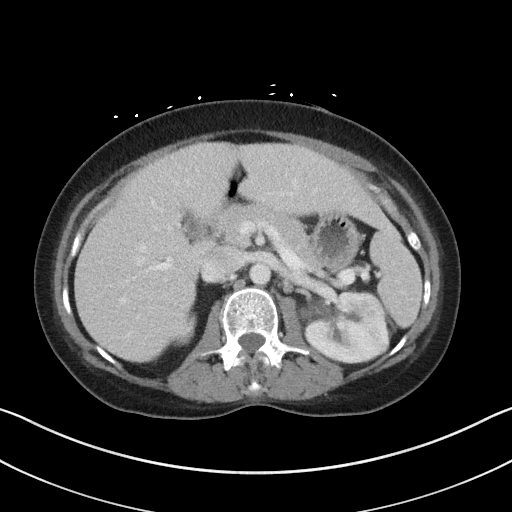
[im 70/88  soft-tissue]
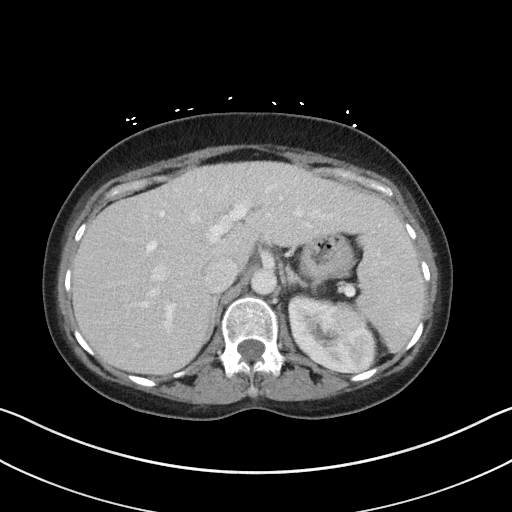
[im 77/88  soft-tissue]
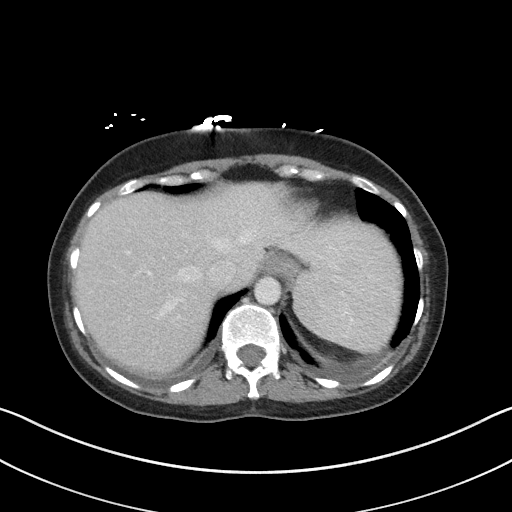
[im 84/88  soft-tissue]
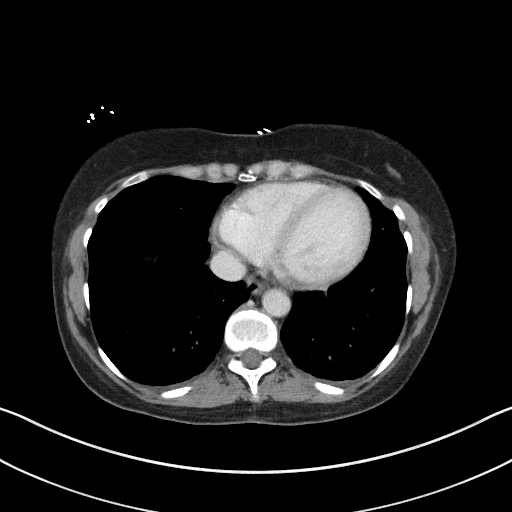

[Series 5: coronal st · coronal · 0.69mm/px · 3 of 85 slices shown]
[im 29/85  soft-tissue]
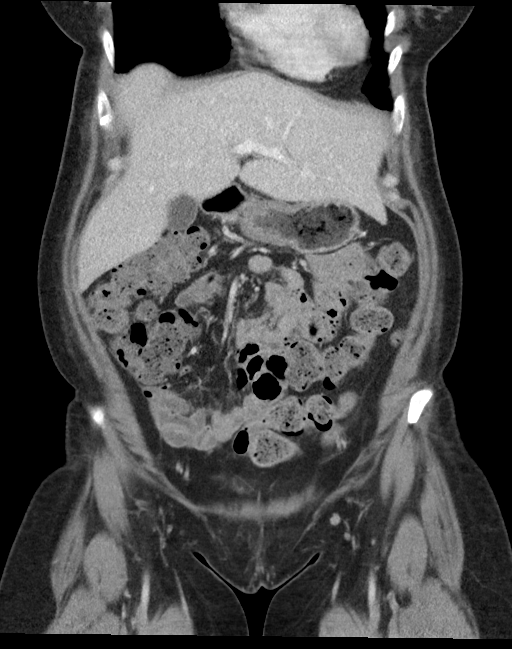
[im 38/85  soft-tissue]
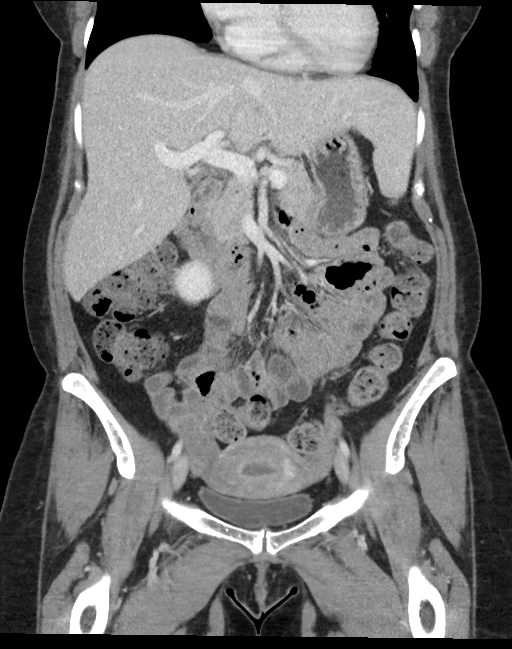
[im 47/85  soft-tissue]
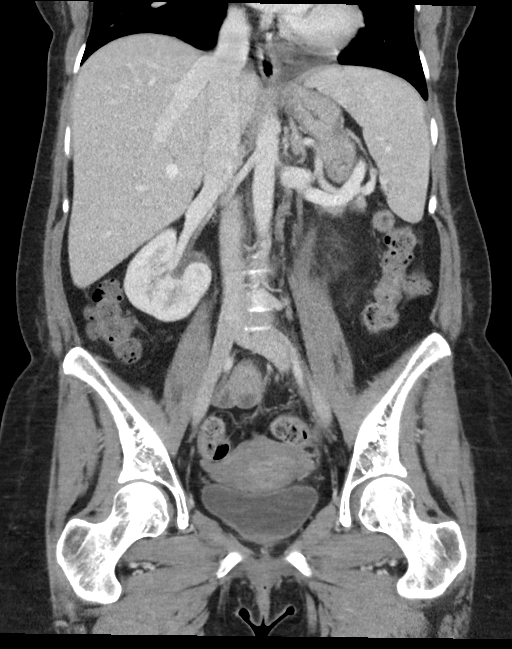

[16 of 46 positions shown; findings below may reference images not displayed]

FINDINGS: Lower chest: Trace bilateral pleural effusions and associated
atelectasis.

Hepatobiliary: No solid liver abnormality is seen. No gallstones,
gallbladder wall thickening, or biliary dilatation.

Pancreas: Unremarkable. No pancreatic ductal dilatation or
surrounding inflammatory changes.

Spleen: Normal in size without significant abnormality.

Adrenals/Urinary Tract: Adrenal glands are unremarkable. There is
subtle, patchy cortical hypodensity of the midportion and superior
pole of the left kidney. There is thickening and fat stranding about
the left ureter. No calculi identified. No hydronephrosis. Bladder
is unremarkable.

Stomach/Bowel: Stomach is within normal limits. Appendix appears
normal. No evidence of bowel wall thickening, distention, or
inflammatory changes. Large burden of stool throughout the colon.

Vascular/Lymphatic: No significant vascular findings are present. No
enlarged abdominal or pelvic lymph nodes.

Reproductive: Small volume fluid in the endometrial cavity.

Other: No abdominal wall hernia or abnormality. Trace free fluid in
the low pelvis.

Musculoskeletal: No acute or significant osseous findings.
IMPRESSION: 1. There is subtle, patchy cortical hypodensity of the midportion
and superior pole of the left kidney. There is thickening and fat
stranding about the left ureter. No calculi identified. Findings are
concerning for pyelonephritis.
2. Trace bilateral pleural effusions and associated atelectasis.
3. Small volume fluid in the endometrial cavity, likely functional
in the reproductive age setting.
4. Trace free fluid in the low pelvis, likely functional in the
reproductive age setting.
5. Large burden of stool throughout the colon.

## 2022-05-25 ENCOUNTER — Ambulatory Visit: Payer: PRIVATE HEALTH INSURANCE | Admitting: Urology

## 2024-12-28 ENCOUNTER — Other Ambulatory Visit: Payer: Self-pay

## 2024-12-28 ENCOUNTER — Emergency Department

## 2024-12-28 ENCOUNTER — Emergency Department
Admission: EM | Admit: 2024-12-28 | Discharge: 2024-12-28 | Disposition: A | Discharge status: Home / Self Care | Attending: Emergency Medicine | Admitting: Emergency Medicine

## 2024-12-28 DIAGNOSIS — R002 Palpitations: Secondary | ICD-10-CM | POA: Diagnosis present

## 2024-12-28 LAB — BASIC METABOLIC PANEL WITH GFR
Anion gap: 12 (ref 5–15)
BUN: 16 mg/dL (ref 6–20)
CO2: 21 mmol/L — ABNORMAL LOW (ref 22–32)
Calcium: 8.9 mg/dL (ref 8.9–10.3)
Chloride: 104 mmol/L (ref 98–111)
Creatinine, Ser: 0.71 mg/dL (ref 0.44–1.00)
GFR, Estimated: >60 mL/min
Glucose, Bld: 93 mg/dL (ref 70–99)
Potassium: 3.6 mmol/L (ref 3.5–5.1)
Sodium: 137 mmol/L (ref 135–145)

## 2024-12-28 LAB — TSH: TSH: 3.53 u[IU]/mL (ref 0.350–4.500)

## 2024-12-28 LAB — CBC
HCT: 38.6 % (ref 36.0–46.0)
Hemoglobin: 12.6 g/dL (ref 12.0–15.0)
MCH: 29.7 pg (ref 26.0–34.0)
MCHC: 32.6 g/dL (ref 30.0–36.0)
MCV: 91 fL (ref 80.0–100.0)
Platelets: 305 10*3/uL (ref 150–400)
RBC: 4.24 MIL/uL (ref 3.87–5.11)
RDW: 13.2 % (ref 11.5–15.5)
WBC: 9.2 10*3/uL (ref 4.0–10.5)
nRBC: 0 % (ref 0.0–0.2)

## 2024-12-28 LAB — D-DIMER, QUANTITATIVE: D-Dimer, Quant: <0.27 ug{FEU}/mL (ref 0.00–0.50)

## 2024-12-28 LAB — T4, FREE: Free T4: 0.94 ng/dL (ref 0.80–2.00)

## 2024-12-28 LAB — TROPONIN T, HIGH SENSITIVITY
Troponin T High Sensitivity: <6 ng/L (ref 0–19)
Troponin T High Sensitivity: <6 ng/L (ref 0–19)

## 2024-12-28 LAB — POC URINE PREG, ED: Preg Test, Ur: NEGATIVE

## 2024-12-28 MED ORDER — LACTATED RINGERS IV BOLUS
1000.0000 mL | Freq: Once | INTRAVENOUS | Status: AC
  Administered 2024-12-28: 1000 mL via INTRAVENOUS

## 2024-12-28 NOTE — ED Triage Notes (Signed)
 Pt to ed from home via POV for heart palpitations x a few hours. Pt has no HX of same. Pt is caox4, in no acute distress and ambulatory to triage.

## 2024-12-28 NOTE — ED Notes (Signed)
 PT given discharge paperwork and all questions were answered to pt's satisfaction. PT is going to wait in the lobby for her dad to come pick her up.

## 2024-12-28 NOTE — Discharge Instructions (Signed)
 Please be sure to keep her self hydrated, can avoid any caffeine.  You can follow-up with your primary care for reassessment this week.

## 2024-12-28 NOTE — ED Provider Notes (Signed)
 [facility] Provider Note    Event Date/Time   First MD Initiated Contact with Patient 12/28/24 1944     (approximate)   History   Irregular Heart Beat   HPI  Alice Sherman is a 39 y.o. female with history of nephrolithiasis, ulcerative colitis, presenting with palpitations.  States been ongoing for several hours.  Denies any cardiac history or history of arrhythmia.  Denies recent travel or surgeries, no history of malignancies, no unilateral calf swelling or tenderness, denies history of DVT, does state that she takes OCPs.  On independent chart review, she was seen by GI in July for IBD, is being treated with IV VDZ.     Physical Exam   Triage Vital Signs: ED Triage Vitals [12/28/24 1754]  Encounter Vitals Group     BP (!) 145/95     Girls Systolic BP Percentile      Girls Diastolic BP Percentile      Boys Systolic BP Percentile      Boys Diastolic BP Percentile      Pulse Rate 62     Resp 18     Temp 98 F (36.7 C)     Temp Source Oral     SpO2 95 %     Weight      Height 5' 1 (1.549 m)     Head Circumference      Peak Flow      Pain Score 0     Pain Loc      Pain Education      Exclude from Growth Chart     Most recent vital signs: Vitals:   12/28/24 2100 12/28/24 2130  BP: 116/76 115/78  Pulse: 86 90  Resp: (!) 26 17  Temp:    SpO2: 99% 100%     General: Awake, no distress.  CV:  Good peripheral perfusion.  Resp:  Normal effort.  No tachypnea respiratory distress Abd:  No distention.  Soft nontender Other:  No unilateral calf swelling or tenderness, no lower extremity edema   ED Results / Procedures / Treatments   Labs (all labs ordered are listed, but only abnormal results are displayed) Labs Reviewed  BASIC METABOLIC PANEL WITH GFR - Abnormal; Notable for the following components:      Result Value   CO2 21 (*)    All other components within normal limits  CBC  D-DIMER, QUANTITATIVE  TSH  T4, FREE  POC  URINE PREG, ED  TROPONIN T, HIGH SENSITIVITY  TROPONIN T, HIGH SENSITIVITY     EKG  EKG shows, sinus tachycardia, rate 108, normal QS, normal QTc, no obvious ischemic ST elevation, T wave flattening in V2, V3, no prior to compare   RADIOLOGY On my independent interpretation, c x-ray without obvious consolidation   PROCEDURES:  Critical Care performed: No  Procedures   MEDICATIONS ORDERED IN ED: Medications  lactated ringers  bolus 1,000 mL (0 mLs Intravenous Stopped 12/28/24 2150)     IMPRESSION / MDM / ASSESSMENT AND PLAN / ED COURSE  I reviewed the triage vital signs and the nursing notes.                              Differential diagnosis includes, but is not limited to, arrhythmia, electrolyte derangements, dehydration, atypical ACS, to consider PE but but other than taking OCPs, she is no other risk factors for it, not short of breath.  Did also consider thyroid dysfunction.  Get labs, EKG, troponin, chest x-ray, pregnancy test.  TSH, D-dimer, IV fluids.  Patient's presentation is most consistent with acute presentation with potential threat to life or bodily function.  Independent interpretation of labs and imaging below.  Labs are reassuring, considered but no indication for inpatient admission at this time, she safe for outpatient management.  Will discharge with instructions to avoid caffeine, to keep her self hydrated, to follow-up with primary care this week to get reassessed.  Strict return precautions given.  Discharge.  The patient is on the cardiac monitor to evaluate for evidence of arrhythmia and/or significant heart rate changes.   Clinical Course as of 12/28/24 2152  Sun Dec 28, 2024  1947 DG Chest 2 View 1. No acute cardiopulmonary abnormality.  [TT]  1947 Independent review of labs, electrolytes really deranged, no leukocytosis, troponins not elevated. [TT]  2125 Preg Test, Ur: NEGATIVE [TT]  2150 D-dimer is not elevated, thyroid labs are within  normal limits. [TT]    Clinical Course User Index [TT] [place], Lorelle Cummins, MD     FINAL CLINICAL IMPRESSION(S) / ED DIAGNOSES   Final diagnoses:  Palpitations     Rx / DC Orders   ED Discharge Orders     None        Note:  This document was prepared using Dragon voice recognition software and may include unintentional dictation errors.    [place] Lorelle Cummins, MD 12/28/24 2152

## 2024-12-29 ENCOUNTER — Encounter: Payer: Self-pay | Admitting: Cardiovascular Disease

## 2024-12-29 ENCOUNTER — Ambulatory Visit: Admitting: Cardiovascular Disease

## 2024-12-29 VITALS — BP 118/78 | HR 84 | Ht 61.0 in | Wt 125.5 lb

## 2024-12-29 DIAGNOSIS — K51919 Ulcerative colitis, unspecified with unspecified complications: Secondary | ICD-10-CM | POA: Diagnosis not present

## 2024-12-29 DIAGNOSIS — R002 Palpitations: Secondary | ICD-10-CM

## 2024-12-29 DIAGNOSIS — E876 Hypokalemia: Secondary | ICD-10-CM

## 2024-12-29 NOTE — Patient Instructions (Addendum)
 Call if you would like a Zio monitor (14 day)  [employer]   Medication Instructions:  No changes  Call if you would like propranolol as needed (lasts 4-6 hrs) Or metoprolol as needed (12 hr)  If you need a refill on your cardiac medications before your next appointment, please call your pharmacy.   Lab work: No new labs needed  Testing/Procedures: No new testing needed  Follow-Up: At [facility], you and your health needs are our priority.  As part of our continuing mission to provide you with exceptional heart care, we have created designated Provider Care Teams.  These Care Teams include your primary Cardiologist (physician) and Advanced Practice Providers (APPs -  Physician Assistants and Nurse Practitioners) who all work together to provide you with the care you need, when you need it.  You will need a follow up appointment as needed  Providers on your designated Care Team:   Lonni Meager, NP [facility] Bring, PA-C [place], [place]  COVID-19 Vaccine Information can be found at: [email or web address] For questions related to vaccine distribution or appointments, please email [email or web address] or call [phone].

## 2024-12-29 NOTE — Progress Notes (Signed)
 Cardiology Office Note  Date:  12/29/2024   ID:  Alice, Sherman 1986-06-08, MRN [id number]  PCP:  [place], [facility]   Chief Complaint  Patient presents with   New Patient (Initial Visit)    Patient c/o palpitations that started last Thursday off & on. Patient was at [facility] 12/28/2024 and was told to follow up with her PCP.     HPI:  Alice Sherman is a 39 y.o. female with past medical history of: Past Medical History:  Diagnosis Date   Anemia    with pregnancy   Breast mass    Irritable bowel syndrome (IBS)    symptoms mostly diarrhea   Kidney stones 10/2016   r kidney stone   Ulcerative colitis (HCC)   Who presents by referral from Dr. Waymond for consultation of her palpitations, irregular heartbeat  On discussion today she reports having episodes of palpitations over the past week Notably Last Thursday she had symptoms  That morning had coffee, and soda Appreciated palpitations on and off Thursday/Friday Symptoms described as Short, fluttering, felt abnormal  Yesterday Sunday, December 28, 2024 Given her persistent symptoms she presented to the emergency room for further evaluation In the emergency room: Blood pressure 145/95 pulse 62 EKG showing sinus tachycardia rate 108 Chest x-ray nonacute Pregnancy test negative, D-dimer not elevated, normal thyroid labs Received IV fluids and discharged after workup negative  No recent ulcerative colitis flares Ulcerative colitis treated at [facility] with Vedolizumab IV  Reports things have been stable  Lab work reviewed Chronically low potassium 3.6 Normal CBC Normal TSH  EKG reviewed personally by myself from yesterday showing sinus tachycardia rate 108 bpm no significant ST-T wave changes  EKG personally reviewed by myself on todays visit EKG Interpretation Date/Time:  Monday December 29 2024 14:46:03 EST Ventricular Rate:  84 PR Interval:  120 QRS Duration:  86 QT Interval:  352 QTC Calculation: 415 R  Axis:   0  Text Interpretation: Normal sinus rhythm Normal ECG When compared with ECG of 28-Dec-2024 17:52, No significant change was found Confirmed by Perla Lye [phone]) on 12/29/2024 2:51:30 PM    PMH:   has a past medical history of Anemia, Breast mass, Irritable bowel syndrome (IBS), Kidney stones (10/2016), and Ulcerative colitis (HCC).   PSH:    Past Surgical History:  Procedure Laterality Date   CYSTOSCOPY W/ URETERAL STENT REMOVAL Left 04/13/2020   Procedure: CYSTOSCOPY/URETEROSCOPY/HOLMIUM LASER/STENT EXCHANGE;  Surgeon: Twylla Glendia BROCKS, MD;  Location: [facility] ORS;  Service: Urology;  Laterality: Left;   CYSTOSCOPY WITH STENT PLACEMENT Left 01/28/2020   Procedure: CYSTOSCOPY URETHRAL WITH STENT PLACEMENT;  Surgeon: Renda Glance, MD;  Location: [facility] OR;  Service: Urology;  Laterality: Left;   CYSTOSCOPY WITH URETEROSCOPY AND STENT PLACEMENT Right 10/28/2016   Procedure: CYSTOSCOPY WITH RIGHT URETEROSCOPY;  Surgeon: Norleen Seltzer, MD;  Location: [facility] ORS;  Service: Urology;  Laterality: Right;   CYSTOSCOPY WITH URETEROSCOPY, STONE BASKETRY AND STENT PLACEMENT Left 04/13/2020   Procedure: CYSTOSCOPY WITH URETEROSCOPY, STONE BASKETRY;  Surgeon: Twylla Glendia BROCKS, MD;  Location: [facility] ORS;  Service: Urology;  Laterality: Left;   CYSTOSCOPY/RETROGRADE/URETEROSCOPY/STONE EXTRACTION WITH BASKET Left 01/28/2020   Procedure: POSSIBLE URETHRAL STONE REMOVAL;  Surgeon: Renda Glance, MD;  Location: [facility] OR;  Service: Urology;  Laterality: Left;    Current Outpatient Medications  Medication Sig Dispense Refill   acetaminophen  (TYLENOL ) 500 MG tablet Take 1,000 mg by mouth every 6 (six) hours as needed for moderate pain or headache.  Creatine Monohydrate 1 g CHEW Chew 1 g by mouth daily.     ENTYVIO 300 MG injection Inject 300 mg as directed every 8 (eight) weeks.      LECITHIN PO Take 1 tablet by mouth in the morning and at bedtime.     MINOXIDIL PO Take by mouth daily.     norethindrone-ethinyl  estradiol-FE (BLISOVI FE 1/20) 1-20 MG-MCG tablet Take 1 tablet by mouth daily.     No current facility-administered medications for this visit.    Allergies:   Cefaclor, Codeine, Ibuprofen, Penicillins, Sulfasalazine, and Sulfa antibiotics   Social History:  The patient  reports that she has never smoked. She has never used smokeless tobacco. She reports that she does not currently use alcohol. She reports that she does not use drugs.   Family History:   family history includes Diverticulitis in her father; Hypertension in her mother; Kidney Stones in her father; Leukemia in her paternal grandfather.    Review of Systems: Review of Systems  Constitutional: Negative.   HENT: Negative.    Respiratory: Negative.    Cardiovascular: Negative.   Gastrointestinal: Negative.   Musculoskeletal: Negative.   Neurological: Negative.   Psychiatric/Behavioral: Negative.    All other systems reviewed and are negative.   PHYSICAL EXAM: VS:  BP 118/78 (BP Location: Right Arm, Patient Position: Sitting, Cuff Size: Normal)   Pulse 84   Ht 5' 1 (1.549 m)   Wt 125 lb 8 oz (56.9 kg)   LMP 12/13/2024   SpO2 98%   BMI 23.71 kg/m  , BMI Body mass index is 23.71 kg/m. GEN: Well nourished, well developed, in no acute distress HEENT: normal Neck: no JVD, carotid bruits, or masses Cardiac: RRR; no murmurs, rubs, or gallops,no edema  Respiratory:  clear to auscultation bilaterally, normal work of breathing GI: soft, nontender, nondistended, + BS MS: no deformity or atrophy Skin: warm and dry, no rash Neuro:  Strength and sensation are intact Psych: euthymic mood, full affect   Recent Labs: 12/28/2024: BUN 16; Creatinine, Ser 0.71; Hemoglobin 12.6; Platelets 305; Potassium 3.6; Sodium 137; TSH 3.530    Lipid Panel No results found for: CHOL, HDL, LDLCALC, TRIG    Wt Readings from Last 3 Encounters:  12/29/24 125 lb 8 oz (56.9 kg)  05/23/21 120 lb (54.4 kg)  05/12/20 132 lb (59.9  kg)     ASSESSMENT AND PLAN:  Problem List Items Addressed This Visit     Ulcerative colitis (HCC)   Hypokalemia   Other Visit Diagnoses       Palpitations    -  Primary   Relevant Orders   EKG 12-Lead (Completed)      Palpitations Unable to exclude PACs or PVCs, even other arrhythmia such as atrial tachycardia or SVT No mention of findings on telemetry in the emergency room December 28, 2024 - Zio monitor offered, she will call us  if symptoms persist - No strong indication for echocardiogram or stress testing - Otherwise normal EKG - Discussed propranolol 10-20 or metoprolol 12.5 to 25 mg as needed for recurrent symptoms - Currently able to do pulse tracking using her watch but does not provide EKG rhythm strip - Discussed Kardia mobile device for long-term tracking when she has symptoms - No other recent stressors, less likely secondary to stimulants  Seen in consultation for Dr. Waymond and will be referred to primary care for ongoing care of the issues detailed above  Signed, Velinda Lunger, M.D., Ph.D. [facility]  [place], [place] [phone]
